# Patient Record
Sex: Male | Born: 1980
Health system: Southern US, Community
[De-identification: ages and names within clinical notes are randomized; demographics above are authoritative.]

## PROBLEM LIST (undated history)

## (undated) DIAGNOSIS — K219 Gastro-esophageal reflux disease without esophagitis: Secondary | ICD-10-CM

## (undated) DIAGNOSIS — G4730 Sleep apnea, unspecified: Secondary | ICD-10-CM

## (undated) DIAGNOSIS — I10 Essential (primary) hypertension: Secondary | ICD-10-CM

## (undated) DIAGNOSIS — T7840XA Allergy, unspecified, initial encounter: Secondary | ICD-10-CM

## (undated) DIAGNOSIS — J45909 Unspecified asthma, uncomplicated: Secondary | ICD-10-CM

## (undated) DIAGNOSIS — Z969 Presence of functional implant, unspecified: Secondary | ICD-10-CM

## (undated) HISTORY — DX: Allergy, unspecified, initial encounter: T78.40XA

## (undated) HISTORY — DX: Gastro-esophageal reflux disease without esophagitis: K21.9

## (undated) HISTORY — DX: Essential (primary) hypertension: I10

## (undated) HISTORY — DX: Sleep apnea, unspecified: G47.30

---

## 2009-09-15 ENCOUNTER — Emergency Department (HOSPITAL_COMMUNITY): Admission: EM | Admit: 2009-09-15 | Discharge: 2009-09-15 | Payer: Self-pay | Admitting: Emergency Medicine

## 2010-01-02 ENCOUNTER — Emergency Department (HOSPITAL_COMMUNITY): Admission: EM | Admit: 2010-01-02 | Discharge: 2010-01-02 | Payer: Self-pay | Admitting: Emergency Medicine

## 2014-07-24 ENCOUNTER — Emergency Department (HOSPITAL_COMMUNITY): Payer: Self-pay

## 2014-07-24 ENCOUNTER — Encounter (HOSPITAL_COMMUNITY): Payer: Self-pay | Admitting: Emergency Medicine

## 2014-07-24 ENCOUNTER — Emergency Department (HOSPITAL_COMMUNITY)
Admission: EM | Admit: 2014-07-24 | Discharge: 2014-07-24 | Disposition: A | Discharge status: Home / Self Care | Payer: Self-pay | Attending: Emergency Medicine | Admitting: Emergency Medicine

## 2014-07-24 DIAGNOSIS — W500XXA Accidental hit or strike by another person, initial encounter: Secondary | ICD-10-CM | POA: Insufficient documentation

## 2014-07-24 DIAGNOSIS — S62317A Displaced fracture of base of fifth metacarpal bone. left hand, initial encounter for closed fracture: Secondary | ICD-10-CM | POA: Insufficient documentation

## 2014-07-24 DIAGNOSIS — Y990 Civilian activity done for income or pay: Secondary | ICD-10-CM | POA: Insufficient documentation

## 2014-07-24 DIAGNOSIS — Z79899 Other long term (current) drug therapy: Secondary | ICD-10-CM | POA: Insufficient documentation

## 2014-07-24 DIAGNOSIS — Y9289 Other specified places as the place of occurrence of the external cause: Secondary | ICD-10-CM | POA: Insufficient documentation

## 2014-07-24 DIAGNOSIS — S62339A Displaced fracture of neck of unspecified metacarpal bone, initial encounter for closed fracture: Secondary | ICD-10-CM

## 2014-07-24 DIAGNOSIS — Y9389 Activity, other specified: Secondary | ICD-10-CM | POA: Insufficient documentation

## 2014-07-24 DIAGNOSIS — J45909 Unspecified asthma, uncomplicated: Secondary | ICD-10-CM | POA: Insufficient documentation

## 2014-07-24 HISTORY — DX: Unspecified asthma, uncomplicated: J45.909

## 2014-07-24 MED ORDER — IBUPROFEN 800 MG PO TABS: 800.0000 mg | ORAL_TABLET | Freq: Three times a day (TID) | ORAL | Status: DC

## 2014-07-24 MED ORDER — HYDROCODONE-ACETAMINOPHEN 5-325 MG PO TABS: 1.0000 | ORAL_TABLET | ORAL | Status: DC | PRN

## 2014-07-24 NOTE — Discharge Instructions (Signed)
Boxer's Fracture You have a break (fracture) of the fifth metacarpal bone. This is commonly called a boxer's fracture. This is the bone in the hand where the little finger attaches. The fracture is in the end of that bone, closest to the little finger. It is usually caused when you hit an object with a clenched fist. Often, the knuckle is pushed down by the impact. Sometimes, the fracture rotates out of position. A boxer's fracture will usually heal within 6 weeks, if it is treated properly and protected from re-injury. Surgery is sometimes needed. A cast, splint, or bulky hand dressing may be used to protect and immobilize a boxer's fracture. Do not remove this device or dressing until your caregiver approves. Keep your hand elevated, and apply ice packs for 15-20 minutes every 2 hours, for the first 2 days. Elevation and ice help reduce swelling and relieve pain. See your caregiver, or an orthopedic specialist, for follow-up care within the next 10 days. This is to make sure your fracture is healing properly. Document Released: 09/01/2005 Document Revised: 11/24/2011 Document Reviewed: 02/19/2007 [facility] Patient Information 2015 [place], [place]. This information is not intended to replace advice given to you by your health care provider. Make sure you discuss any questions you have with your health care provider. Cryotherapy Cryotherapy means treatment with cold. Ice or gel packs can be used to reduce both pain and swelling. Ice is the most helpful within the first 24 to 48 hours after an injury or flare-up from overusing a muscle or joint. Sprains, strains, spasms, burning pain, shooting pain, and aches can all be eased with ice. Ice can also be used when recovering from surgery. Ice is effective, has very few side effects, and is safe for most people to use. PRECAUTIONS  Ice is not a safe treatment option for people with:  Raynaud phenomenon. This is a condition affecting small blood vessels in the  extremities. Exposure to cold may cause your problems to return.  Cold hypersensitivity. There are many forms of cold hypersensitivity, including:  Cold urticaria. Red, itchy hives appear on the skin when the tissues begin to warm after being iced.  Cold erythema. This is a red, itchy rash caused by exposure to cold.  Cold hemoglobinuria. Red blood cells break down when the tissues begin to warm after being iced. The hemoglobin that carry oxygen are passed into the urine because they cannot combine with blood proteins fast enough.  Numbness or altered sensitivity in the area being iced. If you have any of the following conditions, do not use ice until you have discussed cryotherapy with your caregiver:  Heart conditions, such as arrhythmia, angina, or chronic heart disease.  High blood pressure.  Healing wounds or open skin in the area being iced.  Current infections.  Rheumatoid arthritis.  Poor circulation.  Diabetes. Ice slows the blood flow in the region it is applied. This is beneficial when trying to stop inflamed tissues from spreading irritating chemicals to surrounding tissues. However, if you expose your skin to cold temperatures for too long or without the proper protection, you can damage your skin or nerves. Watch for signs of skin damage due to cold. HOME CARE INSTRUCTIONS Follow these tips to use ice and cold packs safely.  Place a dry or damp towel between the ice and skin. A damp towel will cool the skin more quickly, so you may need to shorten the time that the ice is used.  For a more rapid response, add  gentle compression to the ice.  Ice for no more than 10 to 20 minutes at a time. The bonier the area you are icing, the less time it will take to get the benefits of ice.  Check your skin after 5 minutes to make sure there are no signs of a poor response to cold or skin damage.  Rest 20 minutes or more between uses.  Once your skin is numb, you can end your  treatment. You can test numbness by very lightly touching your skin. The touch should be so light that you do not see the skin dimple from the pressure of your fingertip. When using ice, most people will feel these normal sensations in this order: cold, burning, aching, and numbness.  Do not use ice on someone who cannot communicate their responses to pain, such as small children or people with dementia. HOW TO MAKE AN ICE PACK Ice packs are the most common way to use ice therapy. Other methods include ice massage, ice baths, and cryosprays. Muscle creams that cause a cold, tingly feeling do not offer the same benefits that ice offers and should not be used as a substitute unless recommended by your caregiver. To make an ice pack, do one of the following:  Place crushed ice or a bag of frozen vegetables in a sealable plastic bag. Squeeze out the excess air. Place this bag inside another plastic bag. Slide the bag into a pillowcase or place a damp towel between your skin and the bag.  Mix 3 parts water with 1 part rubbing alcohol. Freeze the mixture in a sealable plastic bag. When you remove the mixture from the freezer, it will be slushy. Squeeze out the excess air. Place this bag inside another plastic bag. Slide the bag into a pillowcase or place a damp towel between your skin and the bag. SEEK MEDICAL CARE IF:  You develop white spots on your skin. This may give the skin a blotchy (mottled) appearance.  Your skin turns blue or pale.  Your skin becomes waxy or hard.  Your swelling gets worse. MAKE SURE YOU:   Understand these instructions.  Will watch your condition.  Will get help right away if you are not doing well or get worse. Document Released: 04/28/2011 Document Revised: 01/16/2014 Document Reviewed: 04/28/2011 [facility] Patient Information 2015 [place], [place]. This information is not intended to replace advice given to you by your health care provider. Make sure you discuss any  questions you have with your health care provider.
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
## 2014-07-27 ENCOUNTER — Encounter (HOSPITAL_BASED_OUTPATIENT_CLINIC_OR_DEPARTMENT_OTHER): Payer: Self-pay | Admitting: *Deleted

## 2014-07-27 ENCOUNTER — Other Ambulatory Visit: Payer: Self-pay | Admitting: Orthopedic Surgery

## 2014-07-28 ENCOUNTER — Ambulatory Visit (HOSPITAL_BASED_OUTPATIENT_CLINIC_OR_DEPARTMENT_OTHER): Payer: Self-pay | Admitting: Anesthesiology

## 2014-07-28 ENCOUNTER — Encounter (HOSPITAL_BASED_OUTPATIENT_CLINIC_OR_DEPARTMENT_OTHER): Payer: Self-pay | Admitting: Anesthesiology

## 2014-07-28 ENCOUNTER — Ambulatory Visit (HOSPITAL_BASED_OUTPATIENT_CLINIC_OR_DEPARTMENT_OTHER)
Admission: RE | Admit: 2014-07-28 | Discharge: 2014-07-28 | Disposition: A | Discharge status: Home / Self Care | Payer: Self-pay | Source: Ambulatory Visit | Attending: Orthopedic Surgery | Admitting: Orthopedic Surgery

## 2014-07-28 ENCOUNTER — Encounter (HOSPITAL_BASED_OUTPATIENT_CLINIC_OR_DEPARTMENT_OTHER)
Admission: RE | Disposition: A | Discharge status: Home / Self Care | Payer: Self-pay | Source: Ambulatory Visit | Attending: Orthopedic Surgery

## 2014-07-28 DIAGNOSIS — M79642 Pain in left hand: Secondary | ICD-10-CM | POA: Insufficient documentation

## 2014-07-28 DIAGNOSIS — Z79899 Other long term (current) drug therapy: Secondary | ICD-10-CM | POA: Insufficient documentation

## 2014-07-28 DIAGNOSIS — J45909 Unspecified asthma, uncomplicated: Secondary | ICD-10-CM | POA: Insufficient documentation

## 2014-07-28 HISTORY — PX: OPEN REDUCTION INTERNAL FIXATION (ORIF) METACARPAL: SHX6234

## 2014-07-28 LAB — POCT HEMOGLOBIN-HEMACUE: Hemoglobin: 15.7 g/dL (ref 13.0–17.0)

## 2014-07-28 SURGERY — OPEN REDUCTION INTERNAL FIXATION (ORIF) METACARPAL
Anesthesia: General | Site: Finger | Laterality: Left

## 2014-07-28 MED ORDER — MIDAZOLAM HCL 2 MG/2ML IJ SOLN
1.0000 mg | INTRAMUSCULAR | Status: DC | PRN
  Administered 2014-07-28: 2 mg via INTRAVENOUS

## 2014-07-28 MED ORDER — FENTANYL CITRATE 0.05 MG/ML IJ SOLN
INTRAMUSCULAR | Status: AC
  Filled 2014-07-28: qty 2

## 2014-07-28 MED ORDER — PROPOFOL 10 MG/ML IV BOLUS
INTRAVENOUS | Status: DC | PRN
  Administered 2014-07-28: 400 mg via INTRAVENOUS

## 2014-07-28 MED ORDER — LACTATED RINGERS IV SOLN
INTRAVENOUS | Status: DC
  Administered 2014-07-28 (×2): via INTRAVENOUS

## 2014-07-28 MED ORDER — PHENYLEPHRINE HCL 10 MG/ML IJ SOLN
INTRAMUSCULAR | Status: DC | PRN
  Administered 2014-07-28: 40 ug via INTRAVENOUS

## 2014-07-28 MED ORDER — DEXAMETHASONE SODIUM PHOSPHATE 4 MG/ML IJ SOLN
INTRAMUSCULAR | Status: DC | PRN
  Administered 2014-07-28: 10 mg via INTRAVENOUS

## 2014-07-28 MED ORDER — MIDAZOLAM HCL 5 MG/5ML IJ SOLN
INTRAMUSCULAR | Status: DC | PRN
  Administered 2014-07-28: 2 mg via INTRAVENOUS

## 2014-07-28 MED ORDER — MIDAZOLAM HCL 2 MG/2ML IJ SOLN
INTRAMUSCULAR | Status: AC
  Filled 2014-07-28: qty 2

## 2014-07-28 MED ORDER — CEFAZOLIN SODIUM-DEXTROSE 2-3 GM-% IV SOLR
2.0000 g | INTRAVENOUS | Status: AC
  Administered 2014-07-28: 2 g via INTRAVENOUS

## 2014-07-28 MED ORDER — ONDANSETRON HCL 4 MG/2ML IJ SOLN
INTRAMUSCULAR | Status: DC | PRN
  Administered 2014-07-28: 4 mg via INTRAVENOUS

## 2014-07-28 MED ORDER — EPHEDRINE SULFATE 50 MG/ML IJ SOLN
INTRAMUSCULAR | Status: DC | PRN
  Administered 2014-07-28: 10 mg via INTRAVENOUS
  Administered 2014-07-28: 15 mg via INTRAVENOUS

## 2014-07-28 MED ORDER — CHLORHEXIDINE GLUCONATE 4 % EX LIQD: 60.0000 mL | Freq: Once | CUTANEOUS | Status: DC

## 2014-07-28 MED ORDER — DEXAMETHASONE SODIUM PHOSPHATE 10 MG/ML IJ SOLN
INTRAMUSCULAR | Status: DC | PRN
  Administered 2014-07-28: 10 mg via INTRAVENOUS

## 2014-07-28 MED ORDER — FENTANYL CITRATE 0.05 MG/ML IJ SOLN
INTRAMUSCULAR | Status: AC
  Filled 2014-07-28: qty 6

## 2014-07-28 MED ORDER — HYDROMORPHONE HCL 1 MG/ML IJ SOLN: 0.2500 mg | INTRAMUSCULAR | Status: DC | PRN

## 2014-07-28 MED ORDER — ALBUTEROL SULFATE HFA 108 (90 BASE) MCG/ACT IN AERS
INHALATION_SPRAY | RESPIRATORY_TRACT | Status: AC
  Filled 2014-07-28: qty 6.7

## 2014-07-28 MED ORDER — ALBUTEROL SULFATE HFA 108 (90 BASE) MCG/ACT IN AERS
2.0000 | INHALATION_SPRAY | Freq: Once | RESPIRATORY_TRACT | Status: AC
  Administered 2014-07-28: 2 via RESPIRATORY_TRACT

## 2014-07-28 MED ORDER — OXYCODONE HCL 5 MG/5ML PO SOLN: 5.0000 mg | Freq: Once | ORAL | Status: DC | PRN

## 2014-07-28 MED ORDER — OXYCODONE HCL 5 MG PO TABS: 5.0000 mg | ORAL_TABLET | Freq: Once | ORAL | Status: DC | PRN

## 2014-07-28 MED ORDER — BUPIVACAINE-EPINEPHRINE (PF) 0.5% -1:200000 IJ SOLN
INTRAMUSCULAR | Status: DC | PRN
  Administered 2014-07-28: 28 mL via PERINEURAL

## 2014-07-28 MED ORDER — OXYCODONE-ACETAMINOPHEN 5-325 MG PO TABS: 1.0000 | ORAL_TABLET | ORAL | Status: DC | PRN

## 2014-07-28 MED ORDER — FENTANYL CITRATE 0.05 MG/ML IJ SOLN
INTRAMUSCULAR | Status: DC | PRN
  Administered 2014-07-28: 100 ug via INTRAVENOUS
  Administered 2014-07-28: 50 ug via INTRAVENOUS

## 2014-07-28 MED ORDER — ONDANSETRON HCL 4 MG/2ML IJ SOLN: 4.0000 mg | Freq: Once | INTRAMUSCULAR | Status: DC | PRN

## 2014-07-28 MED ORDER — FENTANYL CITRATE 0.05 MG/ML IJ SOLN
50.0000 ug | INTRAMUSCULAR | Status: DC | PRN
  Administered 2014-07-28: 100 ug via INTRAVENOUS

## 2014-07-28 MED ORDER — CEFAZOLIN SODIUM-DEXTROSE 2-3 GM-% IV SOLR
INTRAVENOUS | Status: AC
  Filled 2014-07-28: qty 50

## 2014-07-28 SURGICAL SUPPLY — 71 items
APL SKNCLS STERI-STRIP NONHPOA (GAUZE/BANDAGES/DRESSINGS)
BANDAGE ELASTIC 3 VELCRO ST LF (GAUZE/BANDAGES/DRESSINGS) ×2 IMPLANT
BANDAGE ELASTIC 4 VELCRO ST LF (GAUZE/BANDAGES/DRESSINGS) IMPLANT
BENZOIN TINCTURE PRP APPL 2/3 (GAUZE/BANDAGES/DRESSINGS) IMPLANT
BIT DRILL 1.1 MINI (BIT) ×1 IMPLANT
BLADE SURG 15 STRL LF DISP TIS (BLADE) ×1 IMPLANT
BLADE SURG 15 STRL SS (BLADE) ×2
BNDG CMPR 9X4 STRL LF SNTH (GAUZE/BANDAGES/DRESSINGS) ×1
BNDG CMPR MD 5X2 ELC HKLP STRL (GAUZE/BANDAGES/DRESSINGS)
BNDG ELASTIC 2 VLCR STRL LF (GAUZE/BANDAGES/DRESSINGS) IMPLANT
BNDG ESMARK 4X9 LF (GAUZE/BANDAGES/DRESSINGS) ×2 IMPLANT
BNDG GAUZE ELAST 4 BULKY (GAUZE/BANDAGES/DRESSINGS) IMPLANT
CANISTER SUCT 1200ML W/VALVE (MISCELLANEOUS) IMPLANT
CORDS BIPOLAR (ELECTRODE) IMPLANT
COVER BACK TABLE 60X90IN (DRAPES) ×2 IMPLANT
CUFF TOURNIQUET SINGLE 18IN (TOURNIQUET CUFF) ×2 IMPLANT
DECANTER SPIKE VIAL GLASS SM (MISCELLANEOUS) IMPLANT
DRAPE EXTREMITY T 121X128X90 (DRAPE) ×2 IMPLANT
DRAPE OEC MINIVIEW 54X84 (DRAPES) ×2 IMPLANT
DRAPE SURG 17X23 STRL (DRAPES) ×2 IMPLANT
DRILL BIT 1.1 MINI (BIT) ×2
DURAPREP 26ML APPLICATOR (WOUND CARE) ×2 IMPLANT
GAUZE SPONGE 4X4 12PLY STRL (GAUZE/BANDAGES/DRESSINGS) ×2 IMPLANT
GAUZE SPONGE 4X4 16PLY XRAY LF (GAUZE/BANDAGES/DRESSINGS) IMPLANT
GAUZE XEROFORM 1X8 LF (GAUZE/BANDAGES/DRESSINGS) IMPLANT
GLOVE BIO SURGEON STRL SZ7.5 (GLOVE) ×2 IMPLANT
GLOVE BIOGEL PI IND STRL 7.0 (GLOVE) ×1 IMPLANT
GLOVE BIOGEL PI IND STRL 8 (GLOVE) ×1 IMPLANT
GLOVE BIOGEL PI INDICATOR 7.0 (GLOVE) ×1
GLOVE BIOGEL PI INDICATOR 8 (GLOVE) ×1
GLOVE ECLIPSE 6.5 STRL STRAW (GLOVE) ×2 IMPLANT
GLOVE SURG SYN 8.0 (GLOVE) ×4 IMPLANT
GOWN STRL REUS W/ TWL LRG LVL3 (GOWN DISPOSABLE) ×1 IMPLANT
GOWN STRL REUS W/TWL LRG LVL3 (GOWN DISPOSABLE) ×2
GOWN STRL REUS W/TWL XL LVL3 (GOWN DISPOSABLE) ×4 IMPLANT
NEEDLE HYPO 25X1 1.5 SAFETY (NEEDLE) IMPLANT
NS IRRIG 1000ML POUR BTL (IV SOLUTION) ×2 IMPLANT
PACK BASIN DAY SURGERY FS (CUSTOM PROCEDURE TRAY) ×2 IMPLANT
PAD CAST 3X4 CTTN HI CHSV (CAST SUPPLIES) ×1 IMPLANT
PAD CAST 4YDX4 CTTN HI CHSV (CAST SUPPLIES) IMPLANT
PADDING CAST ABS 4INX4YD NS (CAST SUPPLIES) ×1
PADDING CAST ABS COTTON 4X4 ST (CAST SUPPLIES) ×1 IMPLANT
PADDING CAST COTTON 3X4 STRL (CAST SUPPLIES) ×2
PADDING CAST COTTON 4X4 STRL (CAST SUPPLIES)
PADDING UNDERCAST 2 STRL (CAST SUPPLIES) ×1
PADDING UNDERCAST 2X4 STRL (CAST SUPPLIES) ×1 IMPLANT
PLATE T 1.5 3H HD/8H SFT (Plate) ×1 IMPLANT
PLATE-T 1.5 3H HD/8H SFT (Plate) ×2 IMPLANT
SCREW CORTEX 1.5X10 (Screw) ×4 IMPLANT
SCREW CORTEX 1.5X16 (Screw) ×6 IMPLANT
SCREW CORTEX 1.5X18 (Screw) ×2 IMPLANT
SHEET MEDIUM DRAPE 40X70 STRL (DRAPES) ×2 IMPLANT
SPLINT PLASTER CAST XFAST 4X15 (CAST SUPPLIES) ×15 IMPLANT
SPLINT PLASTER XTRA FAST SET 4 (CAST SUPPLIES) ×15
STOCKINETTE 4X48 STRL (DRAPES) ×2 IMPLANT
STRIP CLOSURE SKIN 1/2X4 (GAUZE/BANDAGES/DRESSINGS) IMPLANT
SUCTION FRAZIER TIP 10 FR DISP (SUCTIONS) IMPLANT
SUT ETHILON 4 0 PS 2 18 (SUTURE) IMPLANT
SUT ETHILON 5 0 PS 2 18 (SUTURE) IMPLANT
SUT MERSILENE 4 0 P 3 (SUTURE) IMPLANT
SUT PROLENE 3 0 PS 2 (SUTURE) ×2 IMPLANT
SUT VIC AB 4-0 P-3 18XBRD (SUTURE) IMPLANT
SUT VIC AB 4-0 P3 18 (SUTURE)
SUT VICRYL 4-0 PS2 18IN ABS (SUTURE) ×2 IMPLANT
SUT VICRYL RAPIDE 4-0 (SUTURE) IMPLANT
SUT VICRYL RAPIDE 4/0 PS 2 (SUTURE) IMPLANT
SYR BULB 3OZ (MISCELLANEOUS) ×2 IMPLANT
SYRINGE 10CC LL (SYRINGE) IMPLANT
TOWEL OR 17X24 6PK STRL BLUE (TOWEL DISPOSABLE) ×4 IMPLANT
TUBE CONNECTING 20X1/4 (TUBING) IMPLANT
UNDERPAD 30X30 INCONTINENT (UNDERPADS AND DIAPERS) ×2 IMPLANT

## 2014-07-28 NOTE — H&P (Signed)
Greg GellWilliam Stokes is an 33 y.o. male.   Chief Complaint: left hand pain HPI: as above s/p left hand trauma with displaced small metacarpal fracture  Past Medical History  Diagnosis Date  . Asthma   . Metacarpal bone fracture 07/23/2014    left small  . Runny nose 07/27/2014    clear drainage  . Rash 07/27/2014    bilateral arm at inner elbow - states is reaction to soap    Past Surgical History  Procedure Laterality Date  . Closed reduction shoulder dislocation Right 09/16/2009    History reviewed. No pertinent family history. Social History:  reports that he has never smoked. He has never used smokeless tobacco. He reports that he drinks alcohol. He reports that he does not use illicit drugs.  Allergies:  Allergies  Allergen Reactions  . Soap Rash    Medications Prior to Admission  Medication Sig Dispense Refill  . albuterol (PROVENTIL HFA;VENTOLIN HFA) 108 (90 BASE) MCG/ACT inhaler Inhale 1 puff into the lungs every 6 (six) hours as needed for wheezing or shortness of breath.    Marland Kitchen. ibuprofen (ADVIL,MOTRIN) 800 MG tablet Take 1 tablet (800 mg total) by mouth 3 (three) times daily. 21 tablet 0  . HYDROcodone-acetaminophen (NORCO/VICODIN) 5-325 MG per tablet Take 1-2 tablets by mouth every 4 (four) hours as needed. 12 tablet 0    No results found for this or any previous visit (from the past 48 hour(s)). No results found.  Review of Systems  All other systems reviewed and are negative.   Height 6' (1.829 m), weight 88.451 kg (195 lb). Physical Exam  Constitutional: He is oriented to person, place, and time. He appears well-developed and well-nourished.  HENT:  Head: Normocephalic and atraumatic.  Cardiovascular: Normal rate.   Respiratory: Effort normal.  Musculoskeletal:       Left hand: He exhibits tenderness, bony tenderness and deformity.  Displaced left small metacarpal fracture  Neurological: He is alert and oriented to person, place, and time.  Skin: Skin  is warm.  Psychiatric: He has a normal mood and affect. His behavior is normal. Judgment and thought content normal.     Assessment/Plan As above   Plan ORIF  Greg Stokes A 07/28/2014, 10:32 AM

## 2014-07-28 NOTE — Transfer of Care (Signed)
Immediate Anesthesia Transfer of Care Note  Patient: Greg Stokes  Procedure(s) Performed: Procedure(s): OPEN REDUCTION INTERNAL FIXATION (ORIF) LEFT SMALL METACARPAL FRACTURE (Left)  Patient Location: PACU  Anesthesia Type:General and Regional  Level of Consciousness: awake, alert  and oriented  Airway & Oxygen Therapy: Patient Spontanous Breathing and Patient connected to face mask oxygen  Post-op Assessment: Report given to PACU RN and Post -op Vital signs reviewed and stable  Post vital signs: Reviewed and stable  Complications: No apparent anesthesia complications

## 2014-07-28 NOTE — Discharge Instructions (Signed)
HAND SURGERY    HOME CARE INSTRUCTIONS    The following instructions have been prepared to help you care for yourself upon your return home today.  Wound Care:  Keep your hand elevated above the level of your heart. Do not allow it to dangle by your side. Keep the dressing dry and do not remove it unless your doctor advises you to do so. He will usually change it at the time of you post-op visit. Moving your fingers is advised to stimulate circulation but will depend on the site of your surgery. Of course, if you have a splint applied your doctor will advise you about movement.  Activity:  Do not drive or operate machinery today. Rest today and then you may return to your normal activity and work as indicated by your physician.  Diet: Drink liquids today or eat a light diet. You may resume a regular diet tomorrow.  General expectations: Pain for two or three days. Fingers may become slightly swollen.   Unexpected Observations- Call your doctor if any of these occur: Severe pain not relieved by pain medication. Elevated temperature. Dressing soaked with blood. Inability to move fingers. White or bluish color to fingers.  Regional Anesthesia Blocks  1. Numbness or the inability to move the "blocked" extremity may last from 3-48 hours after placement. The length of time depends on the medication injected and your individual response to the medication. If the numbness is not going away after 48 hours, call your surgeon.  2. The extremity that is blocked will need to be protected until the numbness is gone and the  Strength has returned. Because you cannot feel it, you will need to take extra care to avoid injury. Because it may be weak, you may have difficulty moving it or using it. You may not know what position it is in without looking at it while the block is in effect.  3. For blocks in the legs and feet, returning to weight bearing and walking needs to be done carefully. You  will need to wait until the numbness is entirely gone and the strength has returned. You should be able to move your leg and foot normally before you try and bear weight or walk. You will need someone to be with you when you first try to ensure you do not fall and possibly risk injury.  4. Bruising and tenderness at the needle site are common side effects and will resolve in a few days.  5. Persistent numbness or new problems with movement should be communicated to the surgeon or the [facility] [place] [phone])/ [facility] [place] [phone]).    Post Anesthesia Home Care Instructions  Activity: Get plenty of rest for the remainder of the day. A responsible adult should stay with you for 24 hours following the procedure.  For the next 24 hours, DO NOT: -Drive a car -Advertising copywriterperate machinery -Drink alcoholic beverages -Take any medication unless instructed by your physician -Make any legal decisions or sign important papers.  Meals: Start with liquid foods such as gelatin or soup. Progress to regular foods as tolerated. Avoid greasy, spicy, heavy foods. If nausea and/or vomiting occur, drink only clear liquids until the nausea and/or vomiting subsides. Call your physician if vomiting continues.  Special Instructions/Symptoms: Your throat may feel dry or sore from the anesthesia or the breathing tube placed in your throat during surgery. If this causes discomfort, gargle with warm salt water. The discomfort should disappear within 24 hours.

## 2014-07-28 NOTE — Anesthesia Preprocedure Evaluation (Addendum)
Anesthesia Evaluation  Patient identified by MRN, date of birth, ID band Patient awake    Reviewed: Allergy & Precautions, H&P , NPO status , Patient's Chart, lab work & pertinent test results  Airway Mallampati: I TM Distance: >3 FB Neck ROM: Full    Dental  (+) Teeth Intact, Dental Advisory Given   Pulmonary  breath sounds clear to auscultation        Cardiovascular Rhythm:Regular Rate:Normal     Neuro/Psych    GI/Hepatic   Endo/Other    Renal/GU      Musculoskeletal   Abdominal   Peds  Hematology   Anesthesia Other Findings   Reproductive/Obstetrics                           Anesthesia Physical Anesthesia Plan  ASA: II  Anesthesia Plan: General   Post-op Pain Management: MAC Combined w/ Regional for Post-op pain   Induction: Intravenous  Airway Management Planned: LMA  Additional Equipment:   Intra-op Plan:   Post-operative Plan: Extubation in OR  Informed Consent: I have reviewed the patients History and Physical, chart, labs and discussed the procedure including the risks, benefits and alternatives for the proposed anesthesia with the patient or authorized representative who has indicated his/her understanding and acceptance.   Dental advisory given  Plan Discussed with: CRNA, Anesthesiologist and Surgeon  Anesthesia Plan Comments:         Anesthesia Quick Evaluation  

## 2014-07-28 NOTE — Anesthesia Procedure Notes (Addendum)
Anesthesia Regional Block:  Supraclavicular block  Pre-Anesthetic Checklist: ,, timeout performed, Correct Patient, Correct Site, Correct Laterality, Correct Procedure, Correct Position, site marked, Risks and benefits discussed,  Surgical consent,  Pre-op evaluation,  At surgeon's request and post-op pain management  Laterality: Left and Upper  Prep: chloraprep       Needles:  Injection technique: Single-shot  Needle Type: Echogenic Stimulator Needle     Needle Length: 5cm 5 cm Needle Gauge: 21 and 21 G    Additional Needles:  Procedures: ultrasound guided (picture in chart) Supraclavicular block Narrative:  Start time: 07/28/2014 10:46 AM End time: 07/28/2014 10:52 AM Injection made incrementally with aspirations every 5 mL.  Performed by: Personally    Procedure Name: LMA Insertion Date/Time: 07/28/2014 11:50 AM Performed by: Zenia ResidesPAYNE, LINDA D Pre-anesthesia Checklist: Patient identified, Emergency Drugs available, Suction available and Patient being monitored Patient Re-evaluated:Patient Re-evaluated prior to inductionOxygen Delivery Method: Circle System Utilized Preoxygenation: Pre-oxygenation with 100% oxygen Intubation Type: IV induction Ventilation: Mask ventilation without difficulty LMA: LMA inserted LMA Size: 5.0 Number of attempts: 1 Airway Equipment and Method: bite block Placement Confirmation: positive ETCO2 Tube secured with: Tape Dental Injury: Teeth and Oropharynx as per pre-operative assessment

## 2014-07-28 NOTE — Progress Notes (Signed)
  Assisted Dr. Crews with left, ultrasound guided, supraclavicular block. Side rails up, monitors on throughout procedure. See vital signs in flow sheet. Tolerated Procedure well. 

## 2014-07-28 NOTE — Op Note (Signed)
See note [phone]

## 2014-07-28 NOTE — Anesthesia Postprocedure Evaluation (Signed)
  Anesthesia Post-op Note  Patient: Greg Stokes  Procedure(s) Performed: Procedure(s): OPEN REDUCTION INTERNAL FIXATION (ORIF) LEFT SMALL METACARPAL FRACTURE (Left)  Patient Location: PACU  Anesthesia Type: General   Level of Consciousness: awake, alert  and oriented  Airway and Oxygen Therapy: Patient Spontanous Breathing  Post-op Pain: mild  Post-op Assessment: Post-op Vital signs reviewed  Post-op Vital Signs: Reviewed  Last Vitals:  Filed Vitals:   07/28/14 1415  BP: 147/92  Pulse: 100  Temp:   Resp: 13    Complications: No apparent anesthesia complications

## 2014-07-31 ENCOUNTER — Encounter (HOSPITAL_BASED_OUTPATIENT_CLINIC_OR_DEPARTMENT_OTHER): Payer: Self-pay | Admitting: Orthopedic Surgery

## 2014-07-31 NOTE — Op Note (Signed)
NAME:  [name], [name]      ACCOUNT NO.:  [id number]  MEDICAL RECORD NO.:  [id number]  LOCATION:                               FACILITY:  [facility]  PHYSICIAN:  Artist PaisMatthew A. Demira Gwynne, M.D.DATE OF BIRTH:  1980/12/25  DATE OF PROCEDURE:  07/28/2014 DATE OF DISCHARGE:  07/28/2014                              OPERATIVE REPORT   PREOPERATIVE DIAGNOSIS:  Displaced intra-articular fracture, left small metacarpal.  POSTOPERATIVE DIAGNOSIS:  Displaced intra-articular fracture, left small metacarpal.  PROCEDURES:  Open reduction and internal fixation of intra-articular fracture, head neck junction metacarpal fracture, left small finger with 1.5 mm Synthes T-plate and lag screws.  SURGEON:  Artist PaisMatthew A. Mina MarbleWeingold, M.D.  ASSISTANT:  None.  ANESTHESIA:  General.  COMPLICATIONS:  None.  DRAINS:  None.  PROCEDURE IN DETAIL:  Patient was taken to the operating suite.  After induction of adequate general anesthetic, left upper extremity was prepped and draped in usual sterile fashion.  An Esmarch was used to exsanguinate the limb.  Tourniquet was then inflated to 250 mmHg.  At this point in time, incision was made over an obvious apex dorsally angulated deformity.  Skin was incised.  The EDQ and EDC of the small finger were retracted radially.  Dissection was carried down to the fracture site.  We subperiosteally stripped the proximal fragment.  The distal fragment was actually 2 fragments split into the metacarpal head. We did dissection to identify all 3 fragments.  We then reduced the intra-articular component and fixed this with two 1.5 mm screws from ulnar to radial, under direct fluoroscopic guidance.  Once this was done, we then reduced the head fragment to the proximal shaft fragment. There was some comminution in dorsal bone fragments that were used as graft intramedullary.  Once we were able to achieve reduction with a reduction clamp, we took a T-plate and cut it with 2 cortical  screws proximal and 2 cortical screws distal to maintain fracture reduction. Intraoperative fluoroscopy revealed adequate reduction in AP, lateral, and oblique view.  The wound was thoroughly irrigated and loosely closed in layers with 4-0 Vicryl to cover the plate and a 3-0 Prolene subcuticular stitch on the skin.  Steri-Strips, 4x4s, fluffs, and an ulnar-gutter splint was applied.  Patient tolerated the procedure well in a concealed fashion.     Artist PaisMatthew A. Mina MarbleWeingold, M.D.     MAW/MEDQ  D:  07/28/2014  T:  07/28/2014  Job:  [id number]

## 2014-08-30 ENCOUNTER — Ambulatory Visit: Payer: Self-pay

## 2014-09-03 ENCOUNTER — Encounter (HOSPITAL_COMMUNITY): Payer: Self-pay | Admitting: Emergency Medicine

## 2014-09-03 ENCOUNTER — Emergency Department (HOSPITAL_COMMUNITY)
Admission: EM | Admit: 2014-09-03 | Discharge: 2014-09-03 | Disposition: A | Discharge status: Home / Self Care | Payer: Self-pay | Attending: Emergency Medicine | Admitting: Emergency Medicine

## 2014-09-03 DIAGNOSIS — Z79899 Other long term (current) drug therapy: Secondary | ICD-10-CM | POA: Insufficient documentation

## 2014-09-03 DIAGNOSIS — J45901 Unspecified asthma with (acute) exacerbation: Secondary | ICD-10-CM

## 2014-09-03 LAB — I-STAT CHEM 8, ED
BUN: 12 mg/dL (ref 6–23)
CREATININE: 1.1 mg/dL (ref 0.50–1.35)
Calcium, Ion: 1.15 mmol/L (ref 1.12–1.23)
Chloride: 104 mEq/L (ref 96–112)
GLUCOSE: 118 mg/dL — AB (ref 70–99)
HCT: 49 % (ref 39.0–52.0)
Hemoglobin: 16.7 g/dL (ref 13.0–17.0)
Potassium: 3.5 mEq/L — ABNORMAL LOW (ref 3.7–5.3)
Sodium: 143 mEq/L (ref 137–147)
TCO2: 22 mmol/L (ref 0–100)

## 2014-09-03 LAB — CBC WITH DIFFERENTIAL/PLATELET
Basophils Absolute: 0 10*3/uL (ref 0.0–0.1)
Basophils Relative: 0 % (ref 0–1)
EOS ABS: 0.5 10*3/uL (ref 0.0–0.7)
EOS PCT: 8 % — AB (ref 0–5)
HEMATOCRIT: 44.6 % (ref 39.0–52.0)
Hemoglobin: 15.7 g/dL (ref 13.0–17.0)
LYMPHS PCT: 32 % (ref 12–46)
Lymphs Abs: 2.2 10*3/uL (ref 0.7–4.0)
MCH: 30.8 pg (ref 26.0–34.0)
MCHC: 35.2 g/dL (ref 30.0–36.0)
MCV: 87.5 fL (ref 78.0–100.0)
MONO ABS: 0.4 10*3/uL (ref 0.1–1.0)
MONOS PCT: 6 % (ref 3–12)
Neutro Abs: 3.8 10*3/uL (ref 1.7–7.7)
Neutrophils Relative %: 54 % (ref 43–77)
Platelets: 250 10*3/uL (ref 150–400)
RBC: 5.1 MIL/uL (ref 4.22–5.81)
RDW: 12.6 % (ref 11.5–15.5)
WBC: 6.9 10*3/uL (ref 4.0–10.5)

## 2014-09-03 MED ORDER — ALBUTEROL (5 MG/ML) CONTINUOUS INHALATION SOLN
10.0000 mg/h | INHALATION_SOLUTION | RESPIRATORY_TRACT | Status: DC
  Administered 2014-09-03: 10 mg/h via RESPIRATORY_TRACT
  Filled 2014-09-03: qty 20

## 2014-09-03 MED ORDER — PREDNISONE 20 MG PO TABS: 40.0000 mg | ORAL_TABLET | Freq: Every day | ORAL | Status: DC

## 2014-09-03 MED ORDER — MAGNESIUM SULFATE 2 GM/50ML IV SOLN
2.0000 g | INTRAVENOUS | Status: AC
  Administered 2014-09-03: 2 g via INTRAVENOUS
  Filled 2014-09-03: qty 50

## 2014-09-03 MED ORDER — ALBUTEROL SULFATE (2.5 MG/3ML) 0.083% IN NEBU
2.5000 mg | INHALATION_SOLUTION | Freq: Once | RESPIRATORY_TRACT | Status: AC
  Administered 2014-09-03: 2.5 mg via RESPIRATORY_TRACT
  Filled 2014-09-03: qty 3

## 2014-09-03 MED ORDER — ALBUTEROL SULFATE HFA 108 (90 BASE) MCG/ACT IN AERS
2.0000 | INHALATION_SPRAY | RESPIRATORY_TRACT | Status: DC | PRN
  Administered 2014-09-03: 2 via RESPIRATORY_TRACT
  Filled 2014-09-03: qty 6.7

## 2014-09-03 MED ORDER — IPRATROPIUM-ALBUTEROL 0.5-2.5 (3) MG/3ML IN SOLN
3.0000 mL | Freq: Once | RESPIRATORY_TRACT | Status: AC
  Administered 2014-09-03: 3 mL via RESPIRATORY_TRACT
  Filled 2014-09-03: qty 3

## 2014-09-03 MED ORDER — ALBUTEROL SULFATE HFA 108 (90 BASE) MCG/ACT IN AERS: 2.0000 | INHALATION_SPRAY | RESPIRATORY_TRACT | Status: DC | PRN

## 2014-09-03 MED ORDER — METHYLPREDNISOLONE SODIUM SUCC 125 MG IJ SOLR
125.0000 mg | Freq: Once | INTRAMUSCULAR | Status: AC
  Administered 2014-09-03: 125 mg via INTRAVENOUS
  Filled 2014-09-03: qty 2

## 2014-09-03 NOTE — ED Notes (Signed)
RT is at bedside, providing breath treatments. NT is placing patient on cardiac monitor. Informed patient, after everyone has stepped from bedside, will come back to bedside.

## 2014-09-03 NOTE — ED Notes (Addendum)
Pt present with audible wheezing, accessory muscle usage noted. Pt states he does have dx of asthma, he moved in to new home in October at has attacks almost night in his new bedroom. Pt did use inhaler PTA. RA sat 93%, pt speaking in short sentences

## 2014-09-03 NOTE — Discharge Instructions (Signed)

## 2014-09-03 NOTE — ED Provider Notes (Signed)
CSN: [id number]     Arrival date & time 09/03/14  2059 History   First MD Initiated Contact with Patient 09/03/14 2103     Chief Complaint  Patient presents with  . Asthma     (Consider location/radiation/quality/duration/timing/severity/associated sxs/prior Treatment) HPI Comments: Patient with past medical history of asthma, presents to the emergency department with chief complaint of asthma exacerbation. He states that the symptoms started earlier today. He states that he recently moved into her new home, and believes that this was his trigger. He denies any fevers, chills, productive cough, chest pain, nausea, or vomiting. He states that he did try using his inhaler, but do not have any relief.  The history is provided by the patient. No language interpreter was used.    Past Medical History  Diagnosis Date  . Asthma   . Metacarpal bone fracture 07/23/2014    left small  . Runny nose 07/27/2014    clear drainage  . Rash 07/27/2014    bilateral arm at inner elbow - states is reaction to soap   Past Surgical History  Procedure Laterality Date  . Closed reduction shoulder dislocation Right 09/16/2009  . Open reduction internal fixation (orif) metacarpal Left 07/28/2014    Procedure: OPEN REDUCTION INTERNAL FIXATION (ORIF) LEFT SMALL METACARPAL FRACTURE;  Surgeon: Dairl PonderMatthew Weingold, MD;  Location: [facility];  Service: Orthopedics;  Laterality: Left;   No family history on file. History  Substance Use Topics  . Smoking status: Never Smoker   . Smokeless tobacco: Never Used  . Alcohol Use: Yes     Comment: 2 x/week    Review of Systems  Constitutional: Negative for fever and chills.  Respiratory: Positive for shortness of breath and wheezing.   Cardiovascular: Negative for chest pain.  Gastrointestinal: Negative for nausea, vomiting, diarrhea and constipation.  Genitourinary: Negative for dysuria.  All other systems reviewed and are  negative.     Allergies  Soap  Home Medications   Prior to Admission medications   Medication Sig Start Date End Date Taking? Authorizing Provider  albuterol (PROVENTIL HFA;VENTOLIN HFA) 108 (90 BASE) MCG/ACT inhaler Inhale 1 puff into the lungs every 6 (six) hours as needed for wheezing or shortness of breath.    Historical Provider, MD  HYDROcodone-acetaminophen (NORCO/VICODIN) 5-325 MG per tablet Take 1-2 tablets by mouth every 4 (four) hours as needed. 07/24/14   Shari A Upstill, PA-C  ibuprofen (ADVIL,MOTRIN) 800 MG tablet Take 1 tablet (800 mg total) by mouth 3 (three) times daily. 07/24/14   Shari A Upstill, PA-C  oxyCODONE-acetaminophen (ROXICET) 5-325 MG per tablet Take 1 tablet by mouth every 4 (four) hours as needed for severe pain. 07/28/14   Dairl PonderMatthew Weingold, MD   BP 170/104 mmHg  Pulse 109  Temp(Src) 98.2 F (36.8 C) (Oral)  Resp 14  Ht 6' (1.829 m)  Wt 195 lb (88.451 kg)  BMI 26.44 kg/m2  SpO2 98% Physical Exam  Constitutional: He is oriented to person, place, and time. He appears well-developed and well-nourished.  HENT:  Head: Normocephalic and atraumatic.  Eyes: Conjunctivae and EOM are normal. Pupils are equal, round, and reactive to light. Right eye exhibits no discharge. Left eye exhibits no discharge. No scleral icterus.  Neck: Normal range of motion. Neck supple. No JVD present.  Cardiovascular: Normal rate, regular rhythm and normal heart sounds.  Exam reveals no gallop and no friction rub.   No murmur heard. Pulmonary/Chest: Effort normal. No respiratory distress. He has wheezes. He has  no rales. He exhibits no tenderness.  Diffuse out of a wheezes and accessory muscle use, patient speaks in 2-3 word sentences, increased work of breathing  Abdominal: Soft. He exhibits no distension and no mass. There is no tenderness. There is no rebound and no guarding.  Musculoskeletal: Normal range of motion. He exhibits no edema or tenderness.  Neurological: He is  alert and oriented to person, place, and time.  Skin: Skin is warm and dry.  Psychiatric: He has a normal mood and affect. His behavior is normal. Judgment and thought content normal.  Nursing note and vitals reviewed.   ED Course  Procedures (including critical care time) Labs Review Labs Reviewed - No data to display  Imaging Review No results found.   EKG Interpretation None      MDM   Final diagnoses:  Asthma exacerbation    Patient with asthma exacerbation, will treat with nebulizer treatments, give solumedrol, and magnesium, will reassess.  11:06 PM Patient is feeling much better. No more wheezing on lung exam. We'll ambulate with pulse oximeter, and will reassess.  Patient ambulated in ED with O2 saturations maintained >90, no current signs of respiratory distress. Lung exam improved after nebulizer treatment. Prednisone given in the ED and pt will bd dc with 5 day burst. Pt states they are breathing at baseline. Pt has been instructed to continue using prescribed medications and to speak with PCP about today's exacerbation.    Roxy Horsemanobert Leovanni Bjorkman, PA-C 09/03/14 2344  Purvis SheffieldForrest Harrison, MD 09/05/14 [phone]

## 2014-10-24 ENCOUNTER — Ambulatory Visit (INDEPENDENT_AMBULATORY_CARE_PROVIDER_SITE_OTHER): Payer: 59 | Admitting: Family Medicine

## 2014-10-24 VITALS — BP 152/92 | HR 90 | Temp 98.2°F | Resp 16 | Ht 70.0 in | Wt 189.0 lb

## 2014-10-24 DIAGNOSIS — L209 Atopic dermatitis, unspecified: Secondary | ICD-10-CM

## 2014-10-24 DIAGNOSIS — M79642 Pain in left hand: Secondary | ICD-10-CM

## 2014-10-24 DIAGNOSIS — S62308S Unspecified fracture of other metacarpal bone, sequela: Secondary | ICD-10-CM

## 2014-10-24 DIAGNOSIS — J4520 Mild intermittent asthma, uncomplicated: Secondary | ICD-10-CM

## 2014-10-24 MED ORDER — ALBUTEROL SULFATE HFA 108 (90 BASE) MCG/ACT IN AERS: 1.0000 | INHALATION_SPRAY | RESPIRATORY_TRACT | Status: DC | PRN

## 2014-10-24 MED ORDER — TRIAMCINOLONE ACETONIDE 0.1 % EX CREA: 1.0000 "application " | TOPICAL_CREAM | Freq: Two times a day (BID) | CUTANEOUS | Status: DC | PRN

## 2014-10-24 NOTE — Progress Notes (Signed)
Subjective:    Patient ID: Greg Stokes, male    DOB: [birth year], 34 y.o.   MRN: [id number]  This chart was scribed for Meredith Staggers, MD by Littie Deeds, Medical Scribe. This patient was seen in Room 13 and the patient's care was started at 1:26 PM.    HPI HPI Comments: Greg Stokes is a 34 y.o. male with a hx of asthma who presents to the [facility] complaining of itching rashes to bilateral antecubital space.  He is a new patient to me. Here for refill of albuterol for asthma. Was last seen at ER December 20th with asthma exacerbation. Treated with prednisone. He is also here for evaluation of a rash.  Left Metacarpal Bone Fracture: Patient also needs a referral to a hand surgeon for a left hand fracture due to an injury in November 2015. He had seen Dr. Dairl Ponder and had surgery of his left small metacarpal. He is established with Dr. Mina Marble already, but just needs a referral. He notes difficulty with tendon flexion and may require another surgery.  Asthma: Before his ER visit, he was using Primatene tablets, 2 tablets per night since August 2015. He was sent home with Proventil inhaler after the ER visit; he has used it 3 times since then. He does not use the Primatene tablets anymore. Patient states that prior to his hospitalization, he had not had any problems with asthma since childhood, 34 years old. He had recently moved a few months ago.  Rash: The rash has been improving. He believes the rashes to be due to the Primatene tablets. Patient has been using hydrocortisone cream, which has been reducing the itching.  Patient teaches self-defense.   There are no active problems to display for this patient.  Past Medical History  Diagnosis Date  . Asthma   . Metacarpal bone fracture 07/23/2014    left small  . Runny nose 07/27/2014    clear drainage  . Rash 07/27/2014    bilateral arm at inner elbow - states is reaction to soap    Past Surgical History  Procedure Laterality Date  . Closed reduction shoulder dislocation Right 09/16/2009  . Open reduction internal fixation (orif) metacarpal Left 07/28/2014    Procedure: OPEN REDUCTION INTERNAL FIXATION (ORIF) LEFT SMALL METACARPAL FRACTURE;  Surgeon: Dairl Ponder, MD;  Location: [facility];  Service: Orthopedics;  Laterality: Left;   Allergies  Allergen Reactions  . Soap Rash   Prior to Admission medications   Medication Sig Start Date End Date Taking? Authorizing Provider  albuterol (PROVENTIL HFA;VENTOLIN HFA) 108 (90 BASE) MCG/ACT inhaler Inhale 2 puffs into the lungs every 4 (four) hours as needed for wheezing or shortness of breath. 09/03/14  Yes Roxy Horseman, PA-C  diphenhydrAMINE (BENADRYL) 25 MG tablet Take 25 mg by mouth every 6 (six) hours as needed for allergies.    Historical Provider, MD  HYDROcodone-acetaminophen (NORCO/VICODIN) 5-325 MG per tablet Take 1-2 tablets by mouth every 4 (four) hours as needed. Patient not taking: Reported on 09/03/2014 07/24/14   Melvenia Beam A Upstill, PA-C  hydrocortisone cream 1 % Apply 1 application topically 3 (three) times daily as needed for itching.    Historical Provider, MD  ibuprofen (ADVIL,MOTRIN) 800 MG tablet Take 1 tablet (800 mg total) by mouth 3 (three) times daily. Patient not taking: Reported on 09/03/2014 07/24/14   Melvenia Beam A Upstill, PA-C  oxyCODONE-acetaminophen (ROXICET) 5-325 MG per tablet Take 1 tablet by mouth every 4 (four) hours  as needed for severe pain. Patient not taking: Reported on 10/24/2014 07/28/14   Dairl Ponder, MD  predniSONE (DELTASONE) 20 MG tablet Take 2 tablets (40 mg total) by mouth daily. Patient not taking: Reported on 10/24/2014 09/03/14   Roxy Horseman, PA-C  tetrahydrozoline-zinc (VISINE-AC) 0.05-0.25 % ophthalmic solution Place 2 drops into both eyes 3 (three) times daily as needed (red/dry eyes).    Historical Provider, MD   History   Social History  . Marital  Status: Single    Spouse Name: N/A    Number of Children: N/A  . Years of Education: N/A   Occupational History  . Not on file.   Social History Main Topics  . Smoking status: Never Smoker   . Smokeless tobacco: Never Used  . Alcohol Use: Yes     Comment: 2 x/week  . Drug Use: No  . Sexual Activity: Not on file   Other Topics Concern  . Not on file   Social History Narrative     Review of Systems  Skin: Positive for rash.       Objective:   Physical Exam  Constitutional: He is oriented to person, place, and time. He appears well-developed and well-nourished. No distress.  HENT:  Head: Normocephalic and atraumatic.  Mouth/Throat: Oropharynx is clear and moist. No oropharyngeal exudate.  Eyes: Pupils are equal, round, and reactive to light.  Neck: Neck supple.  Cardiovascular: Normal rate.   Pulmonary/Chest: Effort normal.  Musculoskeletal: He exhibits no edema.  Neurological: He is alert and oriented to person, place, and time. No cranial nerve deficit.  Skin: Skin is warm and dry. Rash noted.  Hyperpigmented, slightly thickened patches over bilateral antecubital space. Left antecubital space: slight excoriation laterally with few raw areas without surrounding erythema or exudate.  Psychiatric: He has a normal mood and affect. His behavior is normal.  Vitals reviewed.     Filed Vitals:   10/24/14 1246  BP: 152/92  Pulse: 90  Temp: 98.2 F (36.8 C)  TempSrc: Oral  Resp: 16  Height:  (1.778 m)  Weight: 189 lb (85.73 kg)  SpO2: 95%       Assessment & Plan:   Greg Stokes is a 34 y.o. male Left hand pain, Closed fracture of 5th metacarpal, sequela - Plan: Ambulatory referral to Hand Surgery  - referred back to prior specialist.    Asthma, mild intermittent, uncomplicated - Plan: albuterol (PROVENTIL HFA;VENTOLIN HFA) 108 (90 BASE) MCG/ACT inhaler  -mild intermittent currently.  Continue albuterol prn. rtc precautions if increased use.    Atopic dermatitis - Plan: triamcinolone cream (KENALOG) 0.1 %  -topical TAC BID prn. eucerin as needed. rtc precautions.   Meds ordered this encounter  Medications  . albuterol (PROVENTIL HFA;VENTOLIN HFA) 108 (90 BASE) MCG/ACT inhaler    Sig: Inhale 1-2 puffs into the lungs every 4 (four) hours as needed for wheezing or shortness of breath.    Dispense:  1 Inhaler    Refill:  0  . triamcinolone cream (KENALOG) 0.1 %    Sig: Apply 1 application topically 2 (two) times daily as needed.    Dispense:  30 g    Refill:  1   Patient Instructions  Eucerin and steroid cream up to twice per day for rash on arms. Albuterol if needed for asthma - if increased use of more than twice per week, or nighttime symptoms - return to change meds.  Return to the clinic or go to the nearest emergency room if  any of your symptoms worsen or new symptoms occur.     I personally performed the services described in this documentation, which was scribed in my presence. The recorded information has been reviewed and considered, and addended by me as needed.

## 2014-10-24 NOTE — Patient Instructions (Signed)
Eucerin and steroid cream up to twice per day for rash on arms. Albuterol if needed for asthma - if increased use of more than twice per week, or nighttime symptoms - return to change meds.  Return to the clinic or go to the nearest emergency room if any of your symptoms worsen or new symptoms occur.

## 2015-01-01 ENCOUNTER — Ambulatory Visit (INDEPENDENT_AMBULATORY_CARE_PROVIDER_SITE_OTHER): Payer: 59 | Admitting: Family Medicine

## 2015-01-01 ENCOUNTER — Encounter (HOSPITAL_COMMUNITY): Payer: Self-pay | Admitting: Emergency Medicine

## 2015-01-01 ENCOUNTER — Emergency Department (HOSPITAL_COMMUNITY)
Admission: EM | Admit: 2015-01-01 | Discharge: 2015-01-01 | Disposition: A | Discharge status: Home / Self Care | Payer: 59 | Attending: Emergency Medicine | Admitting: Emergency Medicine

## 2015-01-01 VITALS — BP 176/100 | HR 94 | Temp 98.3°F | Resp 18 | Ht 70.0 in | Wt 196.8 lb

## 2015-01-01 DIAGNOSIS — Y9389 Activity, other specified: Secondary | ICD-10-CM | POA: Diagnosis not present

## 2015-01-01 DIAGNOSIS — J45909 Unspecified asthma, uncomplicated: Secondary | ICD-10-CM | POA: Diagnosis not present

## 2015-01-01 DIAGNOSIS — Y9289 Other specified places as the place of occurrence of the external cause: Secondary | ICD-10-CM | POA: Insufficient documentation

## 2015-01-01 DIAGNOSIS — W540XXA Bitten by dog, initial encounter: Secondary | ICD-10-CM | POA: Diagnosis not present

## 2015-01-01 DIAGNOSIS — Y998 Other external cause status: Secondary | ICD-10-CM | POA: Diagnosis not present

## 2015-01-01 DIAGNOSIS — Z79899 Other long term (current) drug therapy: Secondary | ICD-10-CM | POA: Diagnosis not present

## 2015-01-01 DIAGNOSIS — S41151A Open bite of right upper arm, initial encounter: Secondary | ICD-10-CM

## 2015-01-01 DIAGNOSIS — S51851A Open bite of right forearm, initial encounter: Secondary | ICD-10-CM | POA: Insufficient documentation

## 2015-01-01 DIAGNOSIS — Z23 Encounter for immunization: Secondary | ICD-10-CM | POA: Insufficient documentation

## 2015-01-01 DIAGNOSIS — Z203 Contact with and (suspected) exposure to rabies: Secondary | ICD-10-CM

## 2015-01-01 MED ORDER — TETANUS-DIPHTH-ACELL PERTUSSIS 5-2.5-18.5 LF-MCG/0.5 IM SUSP
0.5000 mL | Freq: Once | INTRAMUSCULAR | Status: AC
  Administered 2015-01-01: 0.5 mL via INTRAMUSCULAR
  Filled 2015-01-01: qty 0.5

## 2015-01-01 MED ORDER — RABIES IMMUNE GLOBULIN 150 UNIT/ML IM INJ
20.0000 [IU]/kg | INJECTION | Freq: Once | INTRAMUSCULAR | Status: AC
Start: 2015-01-01 — End: 2015-01-01
  Administered 2015-01-01: 1800 [IU] via INTRAMUSCULAR
  Filled 2015-01-01: qty 12

## 2015-01-01 MED ORDER — AMOXICILLIN-POT CLAVULANATE 875-125 MG PO TABS: 1.0000 | ORAL_TABLET | Freq: Two times a day (BID) | ORAL | Status: DC

## 2015-01-01 MED ORDER — RABIES VACCINE, PCEC IM SUSR
1.0000 mL | Freq: Once | INTRAMUSCULAR | Status: AC
  Administered 2015-01-01: 1 mL via INTRAMUSCULAR
  Filled 2015-01-01: qty 1

## 2015-01-01 NOTE — ED Provider Notes (Signed)
CSN: [id number]     Arrival date & time 01/01/15  1704 History  This chart is scribed for non-physician practitioner, Junius Finner, PA-C, working with Arby Barrette, MD by Abel Presto, ED Scribe.  This patient was seen in room TR07C/TR07C and the patient's care was started 5:36 PM.      Chief Complaint  Patient presents with  . Animal Bite    Patient is a 34 y.o. male presenting with animal bite. The history is provided by the patient. No language interpreter was used.  Animal Bite Associated symptoms: no fever    HPI Comments: Greg Stokes is a 34 y.o. male who presents to the Emergency Department complaining of dog bite to right posterior forearm with onset last night. Pt states dog was medium sized lab mix. Pt does not know the dog but animal control was contacted.  Pt states the dog has not been captured yet and is uncertain if it would be able to be observed for 10 days and wants to get the rabies vaccine to be safe.  Pt was seen at Urgent Care and sent here for evaluation and rabies vaccination as pt has a small abrasion with some bruising to Right forearm.  Pain is 1/10 at worst. No other injuries from the dog.  Pt is unsure of tetanus status. Pt denies fever and any other complaints.   Past Medical History  Diagnosis Date  . Asthma   . Metacarpal bone fracture 07/23/2014    left small  . Runny nose 07/27/2014    clear drainage  . Rash 07/27/2014    bilateral arm at inner elbow - states is reaction to soap   Past Surgical History  Procedure Laterality Date  . Closed reduction shoulder dislocation Right 09/16/2009  . Open reduction internal fixation (orif) metacarpal Left 07/28/2014    Procedure: OPEN REDUCTION INTERNAL FIXATION (ORIF) LEFT SMALL METACARPAL FRACTURE;  Surgeon: Dairl Ponder, MD;  Location: [facility];  Service: Orthopedics;  Laterality: Left;   Family History  Problem Relation Age of Onset  . Cancer Mother   . Hyperlipidemia  Father    History  Substance Use Topics  . Smoking status: Never Smoker   . Smokeless tobacco: Never Used  . Alcohol Use: 1.8 oz/week    3 Glasses of wine per week     Comment: 2 x/week    Review of Systems  Constitutional: Negative for fever and chills.  Skin: Positive for wound.  All other systems reviewed and are negative.     Allergies  Soap  Home Medications   Prior to Admission medications   Medication Sig Start Date End Date Taking? Authorizing Provider  albuterol (PROVENTIL HFA;VENTOLIN HFA) 108 (90 BASE) MCG/ACT inhaler Inhale 1-2 puffs into the lungs every 4 (four) hours as needed for wheezing or shortness of breath. 10/24/14   Shade Flood, MD  amoxicillin-clavulanate (AUGMENTIN) 875-125 MG per tablet Take 1 tablet by mouth 2 (two) times daily. 01/01/15   Junius Finner, PA-C  diphenhydrAMINE (BENADRYL) 25 MG tablet Take 25 mg by mouth every 6 (six) hours as needed for allergies.    Historical Provider, MD   BP 166/84 mmHg  Pulse 84  Temp(Src) 98.1 F (36.7 C) (Oral)  Resp 22  SpO2 100% Physical Exam  Constitutional: He is oriented to person, place, and time. He appears well-developed and well-nourished.  HENT:  Head: Normocephalic and atraumatic.  Eyes: EOM are normal.  Neck: Normal range of motion.  Cardiovascular: Normal rate.  Pulses:      Radial pulses are 2+ on the right side.  Pulmonary/Chest: Effort normal.  Musculoskeletal: Normal range of motion.  Neurological: He is alert and oriented to person, place, and time.  Sensation intact  Skin: Skin is warm and dry.  3 cm area of ecchymosis on volar aspect of right forearm; pinpoint area of dried red blood, no active discharge or bleeding, no induration or fluctuance  Psychiatric: He has a normal mood and affect. His behavior is normal.  Nursing note and vitals reviewed.   ED Course  Procedures   The wound is cleansed, debrided of foreign material as much as possible, and dressed. The patient  is alerted to watch for any signs of infection (redness, pus, pain, increased swelling or fever) and call if such occurs. Home wound care instructions are provided. Tetanus vaccination status reviewed: tetanus   DIAGNOSTIC STUDIES: Oxygen Saturation is 95% on room air, normal by my interpretation.    COORDINATION OF CARE: 5:39 PM Discussed treatment plan with patient at beside, the patient agrees with the plan and has no further questions at this time.   Labs Review Labs Reviewed - No data to display  Imaging Review No results found.   EKG Interpretation None      MDM   Final diagnoses:  Dog bite  Need for post exposure prophylaxis for rabies   Pt presenting to ED for rabies vaccine. Wound cleaned. Pt given Tdap in ED. Will place on Augmentin. Home care instrucitons provided as well as rabies vaccine schedule to f/u with urgent care. Pt verbalized understanding and agreement with tx plan.    I personally performed the services described in this documentation, which was scribed in my presence. The recorded information has been reviewed and is accurate.    Junius Finnerrin O'Malley, PA-C 01/02/15 [phone]  Arby BarretteMarcy Pfeiffer, MD 01/10/15 [phone]

## 2015-01-01 NOTE — ED Notes (Signed)
Pt sent here for eval for rabies vaccination after being bitten by a dog yesterday; pt with small abrasion to right arm

## 2015-01-01 NOTE — Progress Notes (Signed)
Subjective: 34 year old healthy male who was bitten or deeply scratched by a dog yesterday. He apparently had petted the dog, then the dog scratched that somebody else. Then the patient went back to the dog at which point the dog attacked his right arm. There is a area of crusted bite or tear of the skin about 1 cm long, and scrape marks and bruises of his right forearm. The dog was the least though had some chain on the neck that would indicate that he might have broken loose.  Objective: The primary area of bruising and erythema is about 10 x 6 cm at the largest diameters. There is a little role of scratch marks along the radial aspect of the volar surface of a right arm. These are parallel scratches that could've come from claw or teeth. Just toward the ulnar aspect from those scratches are the deeper marks and a larger area of bruising as outlined above. It is not obviously infected as of yet. Hand function is fine.  Assessment: Dog bite versus deep scratch right forearm  Plan: Spoke to animal control regarding this and they're looking for the fax on it. I informed them that this is something that would need to be checked out to try and locate the dog. As the animal control stated, whether it is a scratch or bite and you don't know which does not make a difference, that further intervention is required. I explained to the patient that if they could not find the dog and impounded he would probably need rabies vaccination. That evaluation and decision is only done at the emergency room, so we'll send him over to the hospital ER.

## 2015-01-01 NOTE — Patient Instructions (Addendum)
[facility]  Augmentin 1 twice daily  Return at any time if concern about worsening infection.

## 2015-01-01 NOTE — ED Notes (Signed)
Declined W/C at D/C and was escorted to lobby by RN. 

## 2015-01-03 ENCOUNTER — Encounter (HOSPITAL_COMMUNITY): Payer: Self-pay | Admitting: *Deleted

## 2015-01-03 ENCOUNTER — Emergency Department (HOSPITAL_COMMUNITY)
Admission: EM | Admit: 2015-01-03 | Discharge: 2015-01-03 | Disposition: A | Discharge status: Home / Self Care | Payer: 59 | Source: Home / Self Care

## 2015-01-03 MED ORDER — RABIES VACCINE, PCEC IM SUSR
1.0000 mL | Freq: Once | INTRAMUSCULAR | Status: AC
  Administered 2015-01-03: 1 mL via INTRAMUSCULAR

## 2015-01-03 MED ORDER — RABIES VACCINE, PCEC IM SUSR
INTRAMUSCULAR | Status: AC
Start: 2015-01-03 — End: 2015-01-03
  Filled 2015-01-03: qty 1

## 2015-01-03 NOTE — ED Notes (Signed)
Pt is here for day 3 rabies injection. Day 1 on 4/18 in [facility]. Pt has schedule with him with dates. Denies any new concerns.

## 2015-01-03 NOTE — ED Notes (Signed)
BP 157/98. [facility]  Pt states BP has been elevated since November. Pt verbalizes understanding.

## 2015-01-04 ENCOUNTER — Other Ambulatory Visit: Payer: Self-pay | Admitting: Family Medicine

## 2015-01-06 ENCOUNTER — Other Ambulatory Visit: Payer: Self-pay | Admitting: Family Medicine

## 2015-02-06 ENCOUNTER — Encounter: Payer: Self-pay | Admitting: Family Medicine

## 2015-02-06 DIAGNOSIS — M79642 Pain in left hand: Secondary | ICD-10-CM

## 2015-02-10 ENCOUNTER — Encounter (HOSPITAL_COMMUNITY): Payer: Self-pay | Admitting: Emergency Medicine

## 2015-02-10 ENCOUNTER — Emergency Department (HOSPITAL_COMMUNITY)
Admission: EM | Admit: 2015-02-10 | Discharge: 2015-02-10 | Disposition: A | Discharge status: Home / Self Care | Payer: 59 | Attending: Emergency Medicine | Admitting: Emergency Medicine

## 2015-02-10 ENCOUNTER — Emergency Department (HOSPITAL_COMMUNITY): Payer: 59

## 2015-02-10 DIAGNOSIS — Z792 Long term (current) use of antibiotics: Secondary | ICD-10-CM | POA: Diagnosis not present

## 2015-02-10 DIAGNOSIS — Z8781 Personal history of (healed) traumatic fracture: Secondary | ICD-10-CM | POA: Diagnosis not present

## 2015-02-10 DIAGNOSIS — J45901 Unspecified asthma with (acute) exacerbation: Secondary | ICD-10-CM | POA: Diagnosis not present

## 2015-02-10 DIAGNOSIS — R0602 Shortness of breath: Secondary | ICD-10-CM | POA: Diagnosis present

## 2015-02-10 MED ORDER — PREDNISONE 20 MG PO TABS
60.0000 mg | ORAL_TABLET | Freq: Once | ORAL | Status: AC
  Administered 2015-02-10: 60 mg via ORAL
  Filled 2015-02-10: qty 3

## 2015-02-10 MED ORDER — ALBUTEROL SULFATE HFA 108 (90 BASE) MCG/ACT IN AERS
1.0000 | INHALATION_SPRAY | RESPIRATORY_TRACT | Status: DC | PRN
  Administered 2015-02-10: 2 via RESPIRATORY_TRACT
  Filled 2015-02-10: qty 6.7

## 2015-02-10 MED ORDER — ALBUTEROL SULFATE (2.5 MG/3ML) 0.083% IN NEBU
5.0000 mg | INHALATION_SOLUTION | Freq: Once | RESPIRATORY_TRACT | Status: AC
  Administered 2015-02-10: 5 mg via RESPIRATORY_TRACT
  Filled 2015-02-10: qty 6

## 2015-02-10 MED ORDER — IPRATROPIUM-ALBUTEROL 0.5-2.5 (3) MG/3ML IN SOLN: 3.0000 mL | RESPIRATORY_TRACT | Status: DC

## 2015-02-10 MED ORDER — PREDNISONE 20 MG PO TABS: 40.0000 mg | ORAL_TABLET | Freq: Every day | ORAL | Status: DC

## 2015-02-10 MED ORDER — IPRATROPIUM-ALBUTEROL 0.5-2.5 (3) MG/3ML IN SOLN
3.0000 mL | Freq: Once | RESPIRATORY_TRACT | Status: AC
  Administered 2015-02-10: 3 mL via RESPIRATORY_TRACT
  Filled 2015-02-10: qty 3

## 2015-02-10 MED ORDER — IPRATROPIUM BROMIDE 0.02 % IN SOLN
0.5000 mg | Freq: Once | RESPIRATORY_TRACT | Status: AC
  Administered 2015-02-10: 0.5 mg via RESPIRATORY_TRACT
  Filled 2015-02-10: qty 2.5

## 2015-02-10 NOTE — ED Notes (Signed)
Pt c/o asthma flare up onset 0115, pt last took his inhaler 30 mins PTA. Pt last had asthma flare up in December. Pt talking in complete sentences without difficulty.

## 2015-02-10 NOTE — ED Notes (Signed)
Patient is alert and orientedx4.  Patient was explained discharge instructions and they understood them with no questions.   

## 2015-02-10 NOTE — ED Provider Notes (Signed)
CSN: [id number]     Arrival date & time 02/10/15  4098 History   First MD Initiated Contact with Patient 02/10/15 [phone]     Chief Complaint  Patient presents with  . Asthma     (Consider location/radiation/quality/duration/timing/severity/associated sxs/prior Treatment) Patient is a 34 y.o. male presenting with asthma. The history is provided by the patient and medical records.  Asthma Associated symptoms include coughing.    This is a 34 year old male with past medical history significant for asthma, presenting to the ED for shortness of breath cough, and wheezing beginning today at 0115.  Patient states for the past 2 days he has had upper respiratory symptoms of cough and nasal congestion. He denies fever, chills, sweats.  States this morning he has felt himself wheezing and has chest tightness consistent with prior asthma attacks.  He denies chest pain, diaphoresis, weakness, numbness, or dizziness.  He has used his home inhaler without relief.  Patient does not have home nebulizer machine.  Past Medical History  Diagnosis Date  . Asthma   . Metacarpal bone fracture 07/23/2014    left small  . Runny nose 07/27/2014    clear drainage  . Rash 07/27/2014    bilateral arm at inner elbow - states is reaction to soap   Past Surgical History  Procedure Laterality Date  . Closed reduction shoulder dislocation Right 09/16/2009  . Open reduction internal fixation (orif) metacarpal Left 07/28/2014    Procedure: OPEN REDUCTION INTERNAL FIXATION (ORIF) LEFT SMALL METACARPAL FRACTURE;  Surgeon: Dairl Ponder, MD;  Location: [facility];  Service: Orthopedics;  Laterality: Left;   Family History  Problem Relation Age of Onset  . Cancer Mother   . Hyperlipidemia Father    History  Substance Use Topics  . Smoking status: Never Smoker   . Smokeless tobacco: Never Used  . Alcohol Use: 1.8 oz/week    3 Glasses of wine per week     Comment: 2 x/week    Review of Systems   Respiratory: Positive for cough, shortness of breath and wheezing.   All other systems reviewed and are negative.     Allergies  Soap  Home Medications   Prior to Admission medications   Medication Sig Start Date End Date Taking? Authorizing Provider  amoxicillin-clavulanate (AUGMENTIN) 875-125 MG per tablet Take 1 tablet by mouth 2 (two) times daily. 01/01/15   Junius Finner, PA-C  diphenhydrAMINE (BENADRYL) 25 MG tablet Take 25 mg by mouth every 6 (six) hours as needed for allergies.    Historical Provider, MD  PROVENTIL HFA 108 (90 BASE) MCG/ACT inhaler INHALE ONE TO TWO PUFFS BY MOUTH EVERY 4 HOURS AS NEEDED FOR WHEEZING FOR SHORTNESS OF BREATH 01/08/15   Todd McVeigh, PA   BP 188/107 mmHg  Pulse 108  Temp(Src) 98.8 F (37.1 C) (Oral)  Resp 16  Ht 5' 11.75" (1.822 m)  Wt 195 lb (88.451 kg)  BMI 26.64 kg/m2  SpO2 95%   Physical Exam  Constitutional: He is oriented to person, place, and time. He appears well-developed and well-nourished. No distress.  HENT:  Head: Normocephalic and atraumatic.  Right Ear: Tympanic membrane and ear canal normal.  Left Ear: Tympanic membrane and ear canal normal.  Nose: Nose normal.  Mouth/Throat: Uvula is midline, oropharynx is clear and moist and mucous membranes are normal. No oropharyngeal exudate, posterior oropharyngeal edema, posterior oropharyngeal erythema or tonsillar abscesses.  Eyes: Conjunctivae and EOM are normal. Pupils are equal, round, and reactive to light.  Neck: Normal range of motion. Neck supple.  Cardiovascular: Normal rate, regular rhythm and normal heart sounds.   Pulmonary/Chest: No respiratory distress. He has wheezes. He has no rhonchi.  Diffuse expiratory wheezes, no retractions or accessory muscle use; speaking in full sentences without difficulty  Abdominal: Soft. Bowel sounds are normal. There is no tenderness. There is no guarding.  Musculoskeletal: Normal range of motion.  Neurological: He is alert and  oriented to person, place, and time.  Skin: Skin is warm and dry. He is not diaphoretic.  Psychiatric: He has a normal mood and affect.  Nursing note and vitals reviewed.   ED Course  Procedures (including critical care time) Labs Review Labs Reviewed - No data to display  Imaging Review Dg Chest 2 View (if Patient Has Fever And/or Copd)  02/10/2015   CLINICAL DATA:  Asthma, wheezing and shortness of breath.  EXAM: CHEST - 2 VIEW  COMPARISON:  None  FINDINGS: Mild bilateral pulmonary hyperinflation and central airway thickening present likely reflecting asthmatic bronchitis. There is no evidence of pulmonary edema, focal airspace consolidation, pneumothorax, nodule or pleural fluid. The heart size and mediastinal contours are within normal limits. The visualized skeletal structures are unremarkable.  IMPRESSION: Mild hyperinflation with suggestion of bilateral central bronchial thickening. Findings are likely consistent with asthmatic bronchitis.   Electronically Signed   By: Irish LackGlenn  Yamagata M.D.   On: 02/10/2015 09:37     EKG Interpretation None      MDM   Final diagnoses:  Asthma, unspecified asthma severity, with acute exacerbation   34 year old male here with asthma exacerbation which began last night. He has recently had URI type symptoms of cough and nasal congestion. Patient afebrile, nontoxic. He has diffuse expiratory wheezes without retractions or accessory muscle use. He is able to speak in full sentences without difficulty. Chest x-ray with findings consistent with asthmatic bronchitis. Patient was given dose of prednisone and 2 nebulizer treatments here in the ED with resolution of symptoms. His vital signs remained stable on room air. Patient does have slight tachycardia, this is likely from albuterol. Do not suspect ACS or PE. Patient will be discharged home on prednisone taper, given refill albuterol inhaler here in ED.  Discussed plan with patient, he/she acknowledged  understanding and agreed with plan of care.  Return precautions given for new or worsening symptoms.  Garlon HatchetLisa M Sanders, PA-C 02/10/15 1310  Purvis SheffieldForrest Harrison, MD 02/10/15 1325

## 2015-02-10 NOTE — ED Notes (Signed)
Pt placed in gown and in bed. Pt monitored by pulse ox, bp cuff, and 5-lead. 

## 2015-02-10 NOTE — Discharge Instructions (Signed)
Take the prescribed medication as directed starting tomorrow, you have already had today's dose. Use inhaler as needed for shortness of breath/wheezing. Return to the ED for new or worsening symptoms.

## 2015-02-14 DIAGNOSIS — Z969 Presence of functional implant, unspecified: Secondary | ICD-10-CM

## 2015-02-14 HISTORY — DX: Presence of functional implant, unspecified: Z96.9

## 2015-02-15 ENCOUNTER — Encounter (HOSPITAL_COMMUNITY): Payer: Self-pay | Admitting: *Deleted

## 2015-02-15 ENCOUNTER — Other Ambulatory Visit: Payer: Self-pay | Admitting: Orthopedic Surgery

## 2015-02-16 ENCOUNTER — Inpatient Hospital Stay (HOSPITAL_COMMUNITY)
Admission: RE | Admit: 2015-02-16 | Discharge: 2015-02-16 | Disposition: A | Discharge status: Home / Self Care | Payer: 59 | Source: Ambulatory Visit

## 2015-02-19 ENCOUNTER — Encounter (HOSPITAL_BASED_OUTPATIENT_CLINIC_OR_DEPARTMENT_OTHER): Payer: Self-pay

## 2015-02-19 ENCOUNTER — Encounter (HOSPITAL_BASED_OUTPATIENT_CLINIC_OR_DEPARTMENT_OTHER)
Admission: RE | Disposition: A | Discharge status: Home / Self Care | Payer: Self-pay | Source: Ambulatory Visit | Attending: Orthopedic Surgery

## 2015-02-19 ENCOUNTER — Ambulatory Visit (HOSPITAL_BASED_OUTPATIENT_CLINIC_OR_DEPARTMENT_OTHER): Payer: 59 | Admitting: Anesthesiology

## 2015-02-19 ENCOUNTER — Telehealth: Payer: Self-pay | Admitting: Family Medicine

## 2015-02-19 ENCOUNTER — Ambulatory Visit (HOSPITAL_BASED_OUTPATIENT_CLINIC_OR_DEPARTMENT_OTHER)
Admission: RE | Admit: 2015-02-19 | Discharge: 2015-02-19 | Disposition: A | Discharge status: Home / Self Care | Payer: 59 | Source: Ambulatory Visit | Attending: Orthopedic Surgery | Admitting: Orthopedic Surgery

## 2015-02-19 DIAGNOSIS — Y838 Other surgical procedures as the cause of abnormal reaction of the patient, or of later complication, without mention of misadventure at the time of the procedure: Secondary | ICD-10-CM | POA: Insufficient documentation

## 2015-02-19 DIAGNOSIS — Z91048 Other nonmedicinal substance allergy status: Secondary | ICD-10-CM | POA: Insufficient documentation

## 2015-02-19 DIAGNOSIS — J45909 Unspecified asthma, uncomplicated: Secondary | ICD-10-CM | POA: Diagnosis not present

## 2015-02-19 DIAGNOSIS — T8484XA Pain due to internal orthopedic prosthetic devices, implants and grafts, initial encounter: Secondary | ICD-10-CM | POA: Insufficient documentation

## 2015-02-19 HISTORY — DX: Presence of functional implant, unspecified: Z96.9

## 2015-02-19 HISTORY — PX: HARDWARE REMOVAL: SHX979

## 2015-02-19 HISTORY — PX: TENOLYSIS: SHX396

## 2015-02-19 LAB — POCT HEMOGLOBIN-HEMACUE: Hemoglobin: 15.9 g/dL (ref 13.0–17.0)

## 2015-02-19 SURGERY — REMOVAL, HARDWARE
Anesthesia: General | Site: Hand | Laterality: Left

## 2015-02-19 MED ORDER — OXYCODONE HCL 5 MG PO TABS: 5.0000 mg | ORAL_TABLET | Freq: Once | ORAL | Status: DC | PRN

## 2015-02-19 MED ORDER — OXYCODONE HCL 5 MG/5ML PO SOLN
5.0000 mg | Freq: Once | ORAL | Status: DC | PRN
Start: 2015-02-19 — End: 2015-02-19

## 2015-02-19 MED ORDER — BUPIVACAINE HCL (PF) 0.25 % IJ SOLN
INTRAMUSCULAR | Status: DC | PRN
  Administered 2015-02-19: 8 mL

## 2015-02-19 MED ORDER — CEFAZOLIN SODIUM-DEXTROSE 2-3 GM-% IV SOLR
2.0000 g | INTRAVENOUS | Status: AC
  Administered 2015-02-19: 2 g via INTRAVENOUS

## 2015-02-19 MED ORDER — HYDROMORPHONE HCL 1 MG/ML IJ SOLN: 0.2500 mg | INTRAMUSCULAR | Status: DC | PRN

## 2015-02-19 MED ORDER — MIDAZOLAM HCL 2 MG/2ML IJ SOLN
INTRAMUSCULAR | Status: AC
  Filled 2015-02-19: qty 2

## 2015-02-19 MED ORDER — LACTATED RINGERS IV SOLN
INTRAVENOUS | Status: DC
  Administered 2015-02-19 (×2): via INTRAVENOUS

## 2015-02-19 MED ORDER — FENTANYL CITRATE (PF) 100 MCG/2ML IJ SOLN
INTRAMUSCULAR | Status: DC | PRN
  Administered 2015-02-19: 100 ug via INTRAVENOUS

## 2015-02-19 MED ORDER — PROMETHAZINE HCL 25 MG/ML IJ SOLN: 6.2500 mg | INTRAMUSCULAR | Status: DC | PRN

## 2015-02-19 MED ORDER — GLYCOPYRROLATE 0.2 MG/ML IJ SOLN: 0.2000 mg | Freq: Once | INTRAMUSCULAR | Status: DC | PRN

## 2015-02-19 MED ORDER — MEPERIDINE HCL 25 MG/ML IJ SOLN: 6.2500 mg | INTRAMUSCULAR | Status: DC | PRN

## 2015-02-19 MED ORDER — KETOROLAC TROMETHAMINE 30 MG/ML IJ SOLN: 30.0000 mg | Freq: Once | INTRAMUSCULAR | Status: DC | PRN

## 2015-02-19 MED ORDER — MIDAZOLAM HCL 2 MG/2ML IJ SOLN
1.0000 mg | INTRAMUSCULAR | Status: DC | PRN
  Administered 2015-02-19: 2 mg via INTRAVENOUS

## 2015-02-19 MED ORDER — CEFAZOLIN SODIUM-DEXTROSE 2-3 GM-% IV SOLR
INTRAVENOUS | Status: AC
  Filled 2015-02-19: qty 50

## 2015-02-19 MED ORDER — LIDOCAINE HCL (CARDIAC) 20 MG/ML IV SOLN
INTRAVENOUS | Status: DC | PRN
  Administered 2015-02-19: 80 mg via INTRAVENOUS

## 2015-02-19 MED ORDER — FENTANYL CITRATE (PF) 100 MCG/2ML IJ SOLN
INTRAMUSCULAR | Status: AC
  Filled 2015-02-19: qty 4

## 2015-02-19 MED ORDER — CHLORHEXIDINE GLUCONATE 4 % EX LIQD: 60.0000 mL | Freq: Once | CUTANEOUS | Status: DC

## 2015-02-19 MED ORDER — ONDANSETRON HCL 4 MG/2ML IJ SOLN
INTRAMUSCULAR | Status: DC | PRN
  Administered 2015-02-19: 4 mg via INTRAVENOUS

## 2015-02-19 MED ORDER — OXYCODONE-ACETAMINOPHEN 5-325 MG PO TABS: 1.0000 | ORAL_TABLET | ORAL | Status: DC | PRN

## 2015-02-19 MED ORDER — PROPOFOL 10 MG/ML IV BOLUS
INTRAVENOUS | Status: DC | PRN
  Administered 2015-02-19: 200 mg via INTRAVENOUS

## 2015-02-19 MED ORDER — FENTANYL CITRATE (PF) 100 MCG/2ML IJ SOLN: 50.0000 ug | INTRAMUSCULAR | Status: DC | PRN

## 2015-02-19 MED ORDER — BUPIVACAINE HCL (PF) 0.25 % IJ SOLN
INTRAMUSCULAR | Status: AC
  Filled 2015-02-19: qty 30

## 2015-02-19 MED ORDER — 0.9 % SODIUM CHLORIDE (POUR BTL) OPTIME
TOPICAL | Status: DC | PRN
  Administered 2015-02-19: 200 mL

## 2015-02-19 MED ORDER — DEXAMETHASONE SODIUM PHOSPHATE 10 MG/ML IJ SOLN
INTRAMUSCULAR | Status: DC | PRN
  Administered 2015-02-19: 10 mg via INTRAVENOUS

## 2015-02-19 SURGICAL SUPPLY — 70 items
APL SKNCLS STERI-STRIP NONHPOA (GAUZE/BANDAGES/DRESSINGS)
BANDAGE ELASTIC 3 VELCRO ST LF (GAUZE/BANDAGES/DRESSINGS) ×2 IMPLANT
BANDAGE ELASTIC 4 VELCRO ST LF (GAUZE/BANDAGES/DRESSINGS) ×2 IMPLANT
BENZOIN TINCTURE PRP APPL 2/3 (GAUZE/BANDAGES/DRESSINGS) IMPLANT
BLADE MINI RND TIP GREEN BEAV (BLADE) IMPLANT
BLADE SURG 15 STRL LF DISP TIS (BLADE) ×2 IMPLANT
BLADE SURG 15 STRL SS (BLADE) ×4
BNDG CMPR 9X4 STRL LF SNTH (GAUZE/BANDAGES/DRESSINGS) ×1
BNDG CMPR MD 5X2 ELC HKLP STRL (GAUZE/BANDAGES/DRESSINGS)
BNDG ELASTIC 2 VLCR STRL LF (GAUZE/BANDAGES/DRESSINGS) IMPLANT
BNDG ESMARK 4X9 LF (GAUZE/BANDAGES/DRESSINGS) ×2 IMPLANT
BNDG GAUZE ELAST 4 BULKY (GAUZE/BANDAGES/DRESSINGS) ×2 IMPLANT
BRUSH SCRUB EZ PLAIN DRY (MISCELLANEOUS) IMPLANT
CORDS BIPOLAR (ELECTRODE) ×2 IMPLANT
COVER BACK TABLE 60X90IN (DRAPES) ×2 IMPLANT
CUFF TOURNIQUET SINGLE 18IN (TOURNIQUET CUFF) ×2 IMPLANT
DECANTER SPIKE VIAL GLASS SM (MISCELLANEOUS) ×2 IMPLANT
DRAPE EXTREMITY T 121X128X90 (DRAPE) ×2 IMPLANT
DRAPE OEC MINIVIEW 54X84 (DRAPES) IMPLANT
DRAPE SURG 17X23 STRL (DRAPES) ×2 IMPLANT
DURAPREP 26ML APPLICATOR (WOUND CARE) ×2 IMPLANT
GAUZE SPONGE 4X4 12PLY STRL (GAUZE/BANDAGES/DRESSINGS) ×2 IMPLANT
GAUZE SPONGE 4X4 16PLY XRAY LF (GAUZE/BANDAGES/DRESSINGS) IMPLANT
GAUZE XEROFORM 1X8 LF (GAUZE/BANDAGES/DRESSINGS) ×2 IMPLANT
GLOVE BIO SURGEON STRL SZ 6.5 (GLOVE) ×2 IMPLANT
GLOVE BIOGEL PI IND STRL 7.0 (GLOVE) ×1 IMPLANT
GLOVE BIOGEL PI INDICATOR 7.0 (GLOVE) ×1
GLOVE SURG SYN 8.0 (GLOVE) ×4 IMPLANT
GOWN STRL REUS W/ TWL LRG LVL3 (GOWN DISPOSABLE) ×1 IMPLANT
GOWN STRL REUS W/TWL LRG LVL3 (GOWN DISPOSABLE) ×2
GOWN STRL REUS W/TWL XL LVL3 (GOWN DISPOSABLE) ×2 IMPLANT
NEEDLE HYPO 25X1 1.5 SAFETY (NEEDLE) ×2 IMPLANT
NS IRRIG 1000ML POUR BTL (IV SOLUTION) ×2 IMPLANT
PACK BASIN DAY SURGERY FS (CUSTOM PROCEDURE TRAY) ×2 IMPLANT
PAD CAST 4YDX4 CTTN HI CHSV (CAST SUPPLIES) ×1 IMPLANT
PADDING CAST ABS 4INX4YD NS (CAST SUPPLIES) ×1
PADDING CAST ABS COTTON 4X4 ST (CAST SUPPLIES) ×1 IMPLANT
PADDING CAST COTTON 4X4 STRL (CAST SUPPLIES) ×2
PADDING UNDERCAST 2 STRL (CAST SUPPLIES)
PADDING UNDERCAST 2X4 STRL (CAST SUPPLIES) IMPLANT
SHEET MEDIUM DRAPE 40X70 STRL (DRAPES) ×2 IMPLANT
SLEEVE SCD COMPRESS KNEE MED (MISCELLANEOUS) IMPLANT
SPLINT PLASTER CAST XFAST 4X15 (CAST SUPPLIES) ×10 IMPLANT
SPLINT PLASTER XTRA FAST SET 4 (CAST SUPPLIES) ×10
STOCKINETTE 4X48 STRL (DRAPES) ×2 IMPLANT
STRIP CLOSURE SKIN 1/2X4 (GAUZE/BANDAGES/DRESSINGS) IMPLANT
SUT ETHIBOND 3-0 V-5 (SUTURE) IMPLANT
SUT ETHILON 3 0 PS 1 (SUTURE) IMPLANT
SUT ETHILON 4 0 PS 2 18 (SUTURE) IMPLANT
SUT ETHILON 5 0 PS 2 18 (SUTURE) IMPLANT
SUT MON AB 4-0 PC3 18 (SUTURE) IMPLANT
SUT PROLENE 3 0 PS 2 (SUTURE) ×2 IMPLANT
SUT PROLENE 6 0 PC 1 (SUTURE) IMPLANT
SUT SILK 4 0 PS 2 (SUTURE) IMPLANT
SUT VIC AB 0 CT3 27 (SUTURE) IMPLANT
SUT VIC AB 0 SH 27 (SUTURE) IMPLANT
SUT VIC AB 2-0 SH 27 (SUTURE)
SUT VIC AB 2-0 SH 27XBRD (SUTURE) IMPLANT
SUT VIC AB 3-0 PS1 18 (SUTURE)
SUT VIC AB 3-0 PS1 18XBRD (SUTURE) IMPLANT
SUT VIC AB 4-0 P-3 18XBRD (SUTURE) ×1 IMPLANT
SUT VIC AB 4-0 P3 18 (SUTURE) ×2
SUT VICRYL 4-0 PS2 18IN ABS (SUTURE) IMPLANT
SUT VICRYL RAPIDE 4-0 (SUTURE) IMPLANT
SUT VICRYL RAPIDE 4/0 PS 2 (SUTURE) IMPLANT
SYR BULB 3OZ (MISCELLANEOUS) ×2 IMPLANT
SYRINGE 10CC LL (SYRINGE) ×2 IMPLANT
TOWEL OR 17X24 6PK STRL BLUE (TOWEL DISPOSABLE) ×4 IMPLANT
TRAY DSU PREP LF (CUSTOM PROCEDURE TRAY) IMPLANT
UNDERPAD 30X30 (UNDERPADS AND DIAPERS) ×2 IMPLANT

## 2015-02-19 NOTE — Anesthesia Postprocedure Evaluation (Signed)
Anesthesia Post Note  Patient: Greg Stokes  Procedure(s) Performed: Procedure(s) (LRB): REMOVAL PLATE/SCREWS WITH CAPSULECTOMY (Left) TENOLYSIS LEFT SMALL FINGER METACARPAL  (Left)  Anesthesia type: General  Patient location: PACU  Post pain: Pain level controlled  Post assessment: Post-op Vital signs reviewed  Last Vitals: BP 155/98 mmHg  Pulse 73  Temp(Src) 36.4 C (Oral)  Resp 18  Ht 5\' 11"  (1.803 m)  Wt 199 lb 9.6 oz (90.538 kg)  BMI 27.85 kg/m2  SpO2 100%  Post vital signs: Reviewed  Level of consciousness: sedated  Complications: No apparent anesthesia complications

## 2015-02-19 NOTE — Telephone Encounter (Signed)
Patient was recently had surgery and is requesting some BP medication. He states that he has never had BP medicine prescribed here before. Should he come [facility] for an OV first[facility] [address]  [phone]

## 2015-02-19 NOTE — Transfer of Care (Signed)
Immediate Anesthesia Transfer of Care Note  Patient: Greg Stokes  Procedure(s) Performed: Procedure(s): REMOVAL PLATE/SCREWS WITH CAPSULECTOMY (Left) TENOLYSIS LEFT SMALL FINGER METACARPAL  (Left)  Patient Location: PACU  Anesthesia Type:General  Level of Consciousness: awake  Airway & Oxygen Therapy: Patient Spontanous Breathing and Patient connected to face mask oxygen  Post-op Assessment: Report given to RN and Post -op Vital signs reviewed and stable  Post vital signs: Reviewed and stable  Last Vitals:  Filed Vitals:   02/19/15 1051  BP:   Pulse: 79  Temp:   Resp: 10    Complications: No apparent anesthesia complications

## 2015-02-19 NOTE — Anesthesia Preprocedure Evaluation (Addendum)
Anesthesia Evaluation  Patient identified by MRN, date of birth, ID band Patient awake    Reviewed: Allergy & Precautions, H&P , NPO status , Patient's Chart, lab work & pertinent test results  Airway Mallampati: I  TM Distance: >3 FB Neck ROM: Full    Dental  (+) Teeth Intact, Dental Advisory Given   Pulmonary asthma (Hx as a child.  ? returning as he has a current URI) ,  breath sounds clear to auscultation        Cardiovascular negative cardio ROS  Rhythm:Regular Rate:Normal     Neuro/Psych negative neurological ROS  negative psych ROS   GI/Hepatic negative GI ROS, Neg liver ROS,   Endo/Other  negative endocrine ROS  Renal/GU negative Renal ROS     Musculoskeletal negative musculoskeletal ROS (+)   Abdominal   Peds  Hematology negative hematology ROS (+)   Anesthesia Other Findings   Reproductive/Obstetrics                             Anesthesia Physical  Anesthesia Plan  ASA: II  Anesthesia Plan: General   Post-op Pain Management:    Induction: Intravenous  Airway Management Planned: LMA  Additional Equipment:   Intra-op Plan:   Post-operative Plan: Extubation in OR  Informed Consent: I have reviewed the patients History and Physical, chart, labs and discussed the procedure including the risks, benefits and alternatives for the proposed anesthesia with the patient or authorized representative who has indicated his/her understanding and acceptance.   Dental advisory given  Plan Discussed with: CRNA, Anesthesiologist and Surgeon  Anesthesia Plan Comments:        Anesthesia Quick Evaluation

## 2015-02-19 NOTE — Op Note (Signed)
See note [id number]

## 2015-02-19 NOTE — Discharge Instructions (Signed)

## 2015-02-19 NOTE — Anesthesia Procedure Notes (Signed)
Procedure Name: LMA Insertion Date/Time: 02/19/2015 9:56 AM Performed by: Caren MacadamARTER, Bauer Ausborn W Pre-anesthesia Checklist: Patient identified, Emergency Drugs available, Suction available and Patient being monitored Patient Re-evaluated:Patient Re-evaluated prior to inductionOxygen Delivery Method: Circle System Utilized Preoxygenation: Pre-oxygenation with 100% oxygen Intubation Type: IV induction Ventilation: Mask ventilation without difficulty LMA: LMA inserted LMA Size: 5.0 Number of attempts: 1 Airway Equipment and Method: Bite block Placement Confirmation: positive ETCO2 and breath sounds checked- equal and bilateral Tube secured with: Tape Dental Injury: Teeth and Oropharynx as per pre-operative assessment

## 2015-02-19 NOTE — H&P (Signed)
Greg Stokes is an 34 y.o. male.   Chief Complaint: left small finger decreased rom HPI: as above s/p orif left small metacarpal fracture with prominent hardware  Past Medical History  Diagnosis Date  . Asthma     prn inhaler  . Retained orthopedic hardware 02/2015    left hand    Past Surgical History  Procedure Laterality Date  . Open reduction internal fixation (orif) metacarpal Left 07/28/2014    Procedure: OPEN REDUCTION INTERNAL FIXATION (ORIF) LEFT SMALL METACARPAL FRACTURE;  Surgeon: Dairl PonderMatthew Lorriann Hansmann, MD;  Location: [facility];  Service: Orthopedics;  Laterality: Left;    Family History  Problem Relation Age of Onset  . Cancer Mother   . Hyperlipidemia Father    Social History:  reports that he has never smoked. He has never used smokeless tobacco. He reports that he drinks alcohol. He reports that he does not use illicit drugs.  Allergies:  Allergies  Allergen Reactions  . Soap Rash    SCENTED SOAPS    Medications Prior to Admission  Medication Sig Dispense Refill  . predniSONE (DELTASONE) 20 MG tablet Take 2 tablets (40 mg total) by mouth daily. Take 40 mg by mouth daily for 3 days, then 20mg  by mouth daily for 3 days, then 10mg  daily for 3 days 12 tablet 0  . PROVENTIL HFA 108 (90 BASE) MCG/ACT inhaler INHALE ONE TO TWO PUFFS BY MOUTH EVERY 4 HOURS AS NEEDED FOR WHEEZING FOR SHORTNESS OF BREATH 18 each 3    No results found for this or any previous visit (from the past 48 hour(s)). No results found.  Review of Systems  All other systems reviewed and are negative.   Height 5' 11.75" (1.822 m), weight 87.998 kg (194 lb). Physical Exam  Constitutional: He is oriented to person, place, and time. He appears well-developed and well-nourished.  HENT:  Head: Normocephalic and atraumatic.  Cardiovascular: Normal rate.   Respiratory: Effort normal.  Musculoskeletal:       Left hand: He exhibits decreased range of motion and tenderness.   S/p orif left small metacarpal with decreased rom and swelling from prominent hardware  Neurological: He is alert and oriented to person, place, and time.  Skin: Skin is warm.  Psychiatric: He has a normal mood and affect. His behavior is normal. Judgment and thought content normal.     Assessment/Plan As above   Plan hardware removal and tenolysis and joint release as needed  Donyel Castagnola A 02/19/2015, 9:27 AM

## 2015-02-20 ENCOUNTER — Encounter (HOSPITAL_BASED_OUTPATIENT_CLINIC_OR_DEPARTMENT_OTHER): Payer: Self-pay | Admitting: Orthopedic Surgery

## 2015-02-20 NOTE — Telephone Encounter (Signed)
Notified pt that he needs to be seen. Pt understood.

## 2015-02-20 NOTE — Op Note (Signed)
NAME:  Greg Stokes, [name]      ACCOUNT NO.:  [id number]  MEDICAL RECORD NO.:  [id number]  LOCATION:                                FACILITY:  [facility]  PHYSICIAN:  Artist PaisMatthew A. Taitum Alms, M.D.DATE OF BIRTH:  08/05/1981  DATE OF PROCEDURE:  02/19/2015 DATE OF DISCHARGE:  02/19/2015                              OPERATIVE REPORT   PREOPERATIVE DIAGNOSIS:  Retained painful hardware, left small finger, status post open reduction and internal fixation, intra-articular fracture, metacarpal head and neck junction.  POSTOPERATIVE DIAGNOSIS:  Retained painful hardware, left small finger, status post open reduction and internal fixation, intra-articular fracture, metacarpal head and neck junction.  PROCEDURE:  Hardware removal, left small finger with capsulectomy, metacarpophalangeal joint, joint manipulation, and extensor tenolysis of the EDC and EDQ, small finger, left hand.  SURGEON:  Artist PaisMatthew A. Mina MarbleWeingold, M.D.  ASSISTANT:  None.  ANESTHESIA:  General.  COMPLICATIONS:  None.  DRAINS:  None.  DESCRIPTION OF PROCEDURE:  The patient was taken the operating suite. After induction of general anesthetic, left upper extremity was prepped and draped in sterile fashion.  An Esmarch was used to exsanguinate the limb.  Tourniquet was inflated to 250 mmHg.  At this point in time, I used to previous incision of the dorsal ulnar aspect of the thumb, metacarpal, head and neck junction area of the left hand.  Skin was incised sharply.  Dissection was carried down to the extensor digitorum communis and extensor digitorum minimi to the small finger.  These were encased in a fair amount of scarring, very carefully we tenolysed to the level of metacarpophalangeal joint.  We then used this interval to dissect down to the plate and 2 screws that were placed outside the plate were removed with no undue difficulty.  The wound was thoroughly irrigated.  We then used a Therapist, nutritionalreer elevator to gently incise and  elevate the dorsal capsule.  We then gently manipulated the metacarpophalangeal and a full flexion and full extension. The wound was thoroughly irrigated and loosely closed in layers of 4-0 Vicryl to close the capsule and 3-0 Prolene horizontal mattress sutures x6 to close the skin.  Xeroform, 4x4s, and compressive dressing were applied.  The patient tolerated the procedure well and went to recovery room in stable fashion.     Artist PaisMatthew A. Mina MarbleWeingold, M.D.     MAW/MEDQ  D:  02/19/2015  T:  02/19/2015  Job:  [id number]

## 2015-02-21 ENCOUNTER — Encounter: Payer: Self-pay | Admitting: Family Medicine

## 2015-02-21 ENCOUNTER — Ambulatory Visit (INDEPENDENT_AMBULATORY_CARE_PROVIDER_SITE_OTHER): Payer: 59 | Admitting: Family Medicine

## 2015-02-21 VITALS — BP 160/100 | HR 78 | Temp 97.9°F | Resp 16 | Ht 71.5 in | Wt 209.6 lb

## 2015-02-21 DIAGNOSIS — R9431 Abnormal electrocardiogram [ECG] [EKG]: Secondary | ICD-10-CM | POA: Diagnosis not present

## 2015-02-21 DIAGNOSIS — I10 Essential (primary) hypertension: Secondary | ICD-10-CM

## 2015-02-21 LAB — COMPREHENSIVE METABOLIC PANEL
ALT: 26 U/L (ref 0–53)
AST: 16 U/L (ref 0–37)
Albumin: 4.7 g/dL (ref 3.5–5.2)
Alkaline Phosphatase: 38 U/L — ABNORMAL LOW (ref 39–117)
BUN: 13 mg/dL (ref 6–23)
CO2: 29 mEq/L (ref 19–32)
Calcium: 9.2 mg/dL (ref 8.4–10.5)
Chloride: 101 mEq/L (ref 96–112)
Creat: 0.88 mg/dL (ref 0.50–1.35)
Glucose, Bld: 111 mg/dL — ABNORMAL HIGH (ref 70–99)
Potassium: 4 mEq/L (ref 3.5–5.3)
Sodium: 138 mEq/L (ref 135–145)
Total Bilirubin: 0.4 mg/dL (ref 0.2–1.2)
Total Protein: 8.3 g/dL (ref 6.0–8.3)

## 2015-02-21 LAB — LIPID PANEL
Cholesterol: 202 mg/dL — ABNORMAL HIGH (ref 0–200)
HDL: 54 mg/dL (ref >=40)
LDL Cholesterol: 132 mg/dL — ABNORMAL HIGH (ref 0–99)
TRIGLYCERIDES: 82 mg/dL (ref <150)
Total CHOL/HDL Ratio: 3.7 Ratio
VLDL: 16 mg/dL (ref 0–40)

## 2015-02-21 MED ORDER — LISINOPRIL 10 MG PO TABS: 10.0000 mg | ORAL_TABLET | Freq: Every day | ORAL | Status: DC

## 2015-02-21 NOTE — Progress Notes (Signed)
[facility] [address], [place] [address] [phone]  Date:  02/21/2015   Name:  Greg GellWilliam Hubbard-Mclear   DOB:  04-12-1981   MRN:  [id number]  PCP:  Tonye PearsonOLITTLE, ROBERT P, MD    Chief Complaint: Hypertension   History of Present Illness:  Greg Stokes is a 34 y.o. very pleasant male patient who presents with the following:  Here today to follow-up on HTN. He had surgery on his left hand 07/2014- he was noted to have elevated BP at that time and it has continued to run a little high. His mother encouraged him to keep an eye on this so he came in to be seen today.  He has never been treated for HTN in the past. There is a family history of HTN- his mother has HTN.   He is otherwise in good health.   He uses his inhaler as needed for wheezing- may use 2x a week.  He has not been able to lift weights much recently due to his hand injury and surgery.  He does cardio, martial arts and boxing.  Right now he just does cardio,  No issues with chest pain or SOB with exercise.   He is not aware of any family history of CAD, except his grandfather may had had an MI in his 3080s.  He is a never smoker He had some cereal this am, OW fasting  BP Readings from Last 3 Encounters:  02/21/15 160/100  02/19/15 155/98  02/10/15 159/95     There are no active problems to display for this patient.   Past Medical History  Diagnosis Date  . Asthma     prn inhaler  . Retained orthopedic hardware 02/2015    left hand    Past Surgical History  Procedure Laterality Date  . Open reduction internal fixation (orif) metacarpal Left 07/28/2014    Procedure: OPEN REDUCTION INTERNAL FIXATION (ORIF) LEFT SMALL METACARPAL FRACTURE;  Surgeon: Dairl PonderMatthew Weingold, MD;  Location: [facility];  Service: Orthopedics;  Laterality: Left;  . Hardware removal Left 02/19/2015    Procedure: REMOVAL PLATE/SCREWS WITH CAPSULECTOMY;  Surgeon: Dairl PonderMatthew Weingold, MD;  Location:  [facility];  Service: Orthopedics;  Laterality: Left;  . Tenolysis Left 02/19/2015    Procedure: TENOLYSIS LEFT SMALL FINGER METACARPAL ;  Surgeon: Dairl PonderMatthew Weingold, MD;  Location: [facility];  Service: Orthopedics;  Laterality: Left;    History  Substance Use Topics  . Smoking status: Never Smoker   . Smokeless tobacco: Never Used  . Alcohol Use: Yes     Comment: 1 x/week    Family History  Problem Relation Age of Onset  . Cancer Mother   . Hyperlipidemia Father     Allergies  Allergen Reactions  . Soap Rash    SCENTED SOAPS    Medication list has been reviewed and updated.  Current Outpatient Prescriptions on File Prior to Visit  Medication Sig Dispense Refill  . oxyCODONE-acetaminophen (ROXICET) 5-325 MG per tablet Take 1 tablet by mouth every 4 (four) hours as needed for severe pain. 30 tablet 0  . PROVENTIL HFA 108 (90 BASE) MCG/ACT inhaler INHALE ONE TO TWO PUFFS BY MOUTH EVERY 4 HOURS AS NEEDED FOR WHEEZING FOR SHORTNESS OF BREATH 18 each 3  . predniSONE (DELTASONE) 20 MG tablet Take 2 tablets (40 mg total) by mouth daily. Take 40 mg by mouth daily for 3 days, then 20mg  by mouth daily for 3 days,  then  daily for 3 days (Patient not taking: Reported on 02/21/2015) 12 tablet 0   No current facility-administered medications on file prior to visit.    Review of Systems:  As per HPI- otherwise negative.   Physical Examination: Filed Vitals:   02/21/15 0907  BP: 160/100  Pulse: 78  Temp: 97.9 F (36.6 C)  Resp: 16   Filed Vitals:   02/21/15 0907  Height: 5' 11.5" (1.816 m)  Weight: 209 lb 9.6 oz (95.074 kg)   Body mass index is 28.83 kg/(m^2). Ideal Body Weight: Weight in (lb) to have BMI = 25: 181.4  GEN: WDWN, NAD, Non-toxic, A & O x 3, muscular build, looks well HEENT: Atraumatic, Normocephalic. Neck supple. No masses, No LAD. Ears and Nose: No external deformity. CV: RRR, No M/G/R. No JVD. No thrill. No extra heart  sounds. PULM: CTA B, no wheezes, crackles, rhonchi. No retractions. No resp. distress. No accessory muscle use. ABD: S, NT, ND. No rebound. No HSM. EXTR: No c/c/e NEURO Normal gait.  PSYCH: Normally interactive. Conversant. Not depressed or anxious appearing.  Calm demeanor.   EKG:  SR, non -specific T wave change  Results for orders placed or performed in visit on 02/21/15  Lipid panel  Result Value Ref Range   Cholesterol 202 (H) 0 - 200 mg/dL   Triglycerides 82 <161 mg/dL   HDL 54 >=09 mg/dL   Total CHOL/HDL Ratio 3.7 Ratio   VLDL 16 0 - 40 mg/dL   LDL Cholesterol 604 (H) 0 - 99 mg/dL  Comprehensive metabolic panel  Result Value Ref Range   Sodium 138 135 - 145 mEq/L   Potassium 4.0 3.5 - 5.3 mEq/L   Chloride 101 96 - 112 mEq/L   CO2 29 19 - 32 mEq/L   Glucose, Bld 111 (H) 70 - 99 mg/dL   BUN 13 6 - 23 mg/dL   Creat 5.40 9.81 - 1.91 mg/dL   Total Bilirubin 0.4 0.2 - 1.2 mg/dL   Alkaline Phosphatase 38 (L) 39 - 117 U/L   AST 16 0 - 37 U/L   ALT 26 0 - 53 U/L   Total Protein 8.3 6.0 - 8.3 g/dL   Albumin 4.7 3.5 - 5.2 g/dL   Calcium 9.2 8.4 - 47.8 mg/dL    Assessment and Plan: Essential hypertension - Plan: EKG 12-Lead, Lipid panel, Comprehensive metabolic panel, lisinopril (PRINIVIL,ZESTRIL) 10 MG tablet  Nonspecific abnormal electrocardiogram (ECG) (EKG) - Plan: Ambulatory referral to Cardiology  He does have elevated BP which has been consistent over several readings.  Start on lisionpril 10.  Given his young age and abnormal EKG will refer for a consultation with cardiology as well. Follow-up pending labs   BP Readings from Last 3 Encounters:  02/21/15 160/100  02/19/15 155/98  02/10/15 159/95     Signed Abbe Amsterdam, MD

## 2015-02-21 NOTE — Patient Instructions (Addendum)
I will be in touch with your labs asap Please start on lisinopril 10mg  once a day.   I will refer you to cardiology as your EKG is slightly abnormal; they may want to do an echocardiogram to check your heart muscle function Please see us in about 4 weeks for a recheck (unless you just saw cardiology); if you could check your BP a few times in between that would be helpful

## 2015-03-14 ENCOUNTER — Telehealth: Payer: Self-pay

## 2015-03-14 ENCOUNTER — Other Ambulatory Visit: Payer: Self-pay

## 2015-03-14 MED ORDER — ALBUTEROL SULFATE HFA 108 (90 BASE) MCG/ACT IN AERS: 1.0000 | INHALATION_SPRAY | RESPIRATORY_TRACT | Status: DC | PRN

## 2015-03-14 NOTE — Telephone Encounter (Signed)
Spoke with pt, advised to RTC to get refill on the Prednisone. Advised Rx for inhaler sent. Pt understood.

## 2015-03-14 NOTE — Telephone Encounter (Signed)
Patient is calling to request a refill for Proventil inhaler and prednisone. Pharm[address] [facility] [address] Patient phone: [phone]

## 2015-03-30 ENCOUNTER — Ambulatory Visit (INDEPENDENT_AMBULATORY_CARE_PROVIDER_SITE_OTHER): Payer: 59 | Admitting: Family Medicine

## 2015-03-30 VITALS — BP 169/106 | HR 75 | Temp 98.1°F | Resp 18 | Ht 70.0 in | Wt 203.2 lb

## 2015-03-30 DIAGNOSIS — Z888 Allergy status to other drugs, medicaments and biological substances status: Secondary | ICD-10-CM | POA: Diagnosis not present

## 2015-03-30 DIAGNOSIS — I10 Essential (primary) hypertension: Secondary | ICD-10-CM

## 2015-03-30 MED ORDER — AMLODIPINE BESYLATE 5 MG PO TABS
ORAL_TABLET | ORAL | Status: DC
Start: 2015-03-30 — End: 2015-05-14

## 2015-03-30 NOTE — Patient Instructions (Signed)
Take the amlodipine 5 mg daily for 1-2 weeks, then increase to 10 mg daily (it all can be taken at the same time).  Get your blood pressure checked at a pharmacy or somewhere once or twice a week and write down the numbers. The goal is to be less than 140/90, ideally down around 120/70.  Avoid excessive salt and get regular exercise  Discontinue the lisinopril and make a note in your personal records that you may be allergic to this  Return in about 4-6 weeks to get your blood pressure rechecked.

## 2015-03-30 NOTE — Progress Notes (Signed)
  Subjective:  Patient ID: Greg Stokes, male    DOB: May 25, 1981  Age: 34 y.o. MRN: [id number]  34 year old man whom I saw several months ago. He had subsequently seen Dr. Dallas Schimkeopeland several times in was placed on lisinopril last visit. He did okay for about the first 3 weeks of the medication, but then he started getting a sore throat. He started in on the new month pack and continue to have problems with the throat being sore and some coughing. He had some runny nose. He thought he might be allergic to the lisinopril so he came in today. His last dose was yesterday morning. He did take a Benadryl a couple times a day.   Objective:   Healthy appearing gentleman. His TMs are normal on the right, some cerumen on the left. Throat has some edema of the uvula, about 1-1/2-2 times normal size. Neck supple without nodes. Chest clear. Heart regular without murmurs.  Assessment & Plan:   Assessment: Allergy to lisinopril Hypertension Uvula edema  Plan:  I do not think this is going to get worse at this point, since he's been off the medicine for a day and a half. However in the event of any difficulty breathing he is to call 911 and or go straight to the emergency room. He is to continue taking his Benadryl about 3 times a day for the next couple of days.  Patient Instructions  Take the amlodipine 5 mg daily for 1-2 weeks, then increase to 10 mg daily (it all can be taken at the same time).  Get your blood pressure checked at a pharmacy or somewhere once or twice a week and write down the numbers. The goal is to be less than 140/90, ideally down around 120/70.  Avoid excessive salt and get regular exercise  Discontinue the lisinopril and make a note in your personal records that you may be allergic to this  Return in about 4-6 weeks to get your blood pressure rechecked.    Richardine Peppers, MD 03/30/2015

## 2015-04-12 ENCOUNTER — Ambulatory Visit (INDEPENDENT_AMBULATORY_CARE_PROVIDER_SITE_OTHER): Payer: 59 | Admitting: Physician Assistant

## 2015-04-12 VITALS — BP 130/80 | HR 98 | Temp 98.0°F | Resp 18 | Ht 70.0 in | Wt 201.0 lb

## 2015-04-12 DIAGNOSIS — J4541 Moderate persistent asthma with (acute) exacerbation: Secondary | ICD-10-CM | POA: Diagnosis not present

## 2015-04-12 DIAGNOSIS — R030 Elevated blood-pressure reading, without diagnosis of hypertension: Secondary | ICD-10-CM

## 2015-04-12 DIAGNOSIS — J309 Allergic rhinitis, unspecified: Secondary | ICD-10-CM

## 2015-04-12 DIAGNOSIS — IMO0001 Reserved for inherently not codable concepts without codable children: Secondary | ICD-10-CM

## 2015-04-12 MED ORDER — BECLOMETHASONE DIPROPIONATE 40 MCG/ACT IN AERS: 1.0000 | INHALATION_SPRAY | Freq: Two times a day (BID) | RESPIRATORY_TRACT | Status: DC

## 2015-04-12 MED ORDER — ALBUTEROL SULFATE (2.5 MG/3ML) 0.083% IN NEBU: 2.5000 mg | INHALATION_SOLUTION | Freq: Four times a day (QID) | RESPIRATORY_TRACT | Status: DC | PRN

## 2015-04-12 NOTE — Patient Instructions (Signed)
Use qvar inhaler twice a day. Ask pharmacist to show you how to use. Use albuterol as needed for shortness of breath, wheezing, chest tightness. Start taking claritin or zyrtec daily. Return in 3 months for follow up.

## 2015-04-12 NOTE — Progress Notes (Addendum)
[facility] [address], [place] [address] [phone]  Date:  04/12/2015   Name:  Greg Stokes   DOB:  1980/12/31   MRN:  [id number]  PCP:  Tonye Pearson, MD    Chief Complaint: Wheezing; Shortness of Breath; and Cough   History of Present Illness:  This is a 34 y.o. male with PMH asthma and allergic rhinitis who is presenting stating he needs a nebulizer machine. Pt states he has had problems with asthma on and off for the past 23 years. He was born in [place] and did not have problems until [place] the [place]. States when he turned 15 he stopped needing albuterol and did not need again until college when he was playing sports. He would use only during sports. After college he did not need albuterol again until 1 year ago. Over the past 6 months ever since surgery to repair a boxers fracture, he has had worsening asthma. He reports 8 months ago they moved to a new house. He is wondering if he is allergic to something in the house. He dusts the house every 2 days. He does have a problem with allergies but does not take anything regularly. He will occ take claritin which does help. Pt currently is using albuterol every morning and wakes up 3 times a week with symptoms and needing to use his inhaler. He has never been on a maintenance inhaler. He has been to the hospital 2 times in the past 6 months with asthma exacerbation and needing prednisone taper and breathing treatments.   Pt was placed on lisinopril for elevated BP 1.5 months ago. He experience angioedema and stopped. He is not on anything currently and BP stable at 130/84. Pt states BP has always been around 130/80s except for the past 8 months since asthma was worsened his BP has gone up.  Review of Systems:  Review of Systems See HPI  There are no active problems to display for this patient.   Prior to Admission medications   Medication Sig Start Date End Date Taking?  Authorizing Provider  albuterol (PROVENTIL HFA;VENTOLIN HFA) 108 (90 BASE) MCG/ACT inhaler Inhale 1-2 puffs into the lungs every 4 (four) hours as needed. 03/14/15  Yes Todd McVeigh, PA  amLODipine (NORVASC) 5 MG tablet Take 5 mg daily for 1 week, then increase to 10 mg 03/30/15  Yes Peyton Najjar, MD  triamcinolone (KENALOG) 0.025 % cream Apply 1 application topically 2 (two) times daily.   Yes Historical Provider, MD           Allergies  Allergen Reactions  . Lisinopril Swelling  . Soap Rash    SCENTED SOAPS    Past Surgical History  Procedure Laterality Date  . Open reduction internal fixation (orif) metacarpal Left 07/28/2014    Procedure: OPEN REDUCTION INTERNAL FIXATION (ORIF) LEFT SMALL METACARPAL FRACTURE;  Surgeon: Dairl Ponder, MD;  Location: [facility];  Service: Orthopedics;  Laterality: Left;  . Hardware removal Left 02/19/2015    Procedure: REMOVAL PLATE/SCREWS WITH CAPSULECTOMY;  Surgeon: Dairl Ponder, MD;  Location: [facility];  Service: Orthopedics;  Laterality: Left;  . Tenolysis Left 02/19/2015    Procedure: TENOLYSIS LEFT SMALL FINGER METACARPAL ;  Surgeon: Dairl Ponder, MD;  Location: [facility];  Service: Orthopedics;  Laterality: Left;    History  Substance Use Topics  . Smoking status: Never Smoker   . Smokeless tobacco: Never  Used  . Alcohol Use: Yes     Comment: 1 x/week    Family History  Problem Relation Age of Onset  . Cancer Mother   . Hyperlipidemia Father     Medication list has been reviewed and updated.  Physical Examination:  Physical Exam  Constitutional: He is oriented to person, place, and time. He appears well-developed and well-nourished. No distress.  HENT:  Head: Normocephalic and atraumatic.  Right Ear: Hearing, tympanic membrane, external ear and ear canal normal.  Left Ear: Hearing, tympanic membrane, external ear and ear canal normal.  Mouth/Throat: Uvula is midline,  oropharynx is clear and moist and mucous membranes are normal.  Nasal mucosa pale  Eyes: Conjunctivae and lids are normal. Right eye exhibits no discharge. Left eye exhibits no discharge. No scleral icterus.  Cardiovascular: Normal rate, regular rhythm, normal heart sounds and normal pulses.   No murmur heard. Pulmonary/Chest: Effort normal. No respiratory distress. He has decreased breath sounds (mildly, throughout). He has no wheezes. He has no rhonchi. He has no rales.  Musculoskeletal: Normal range of motion.  Lymphadenopathy:       Head (right side): No submental, no submandibular and no tonsillar adenopathy present.       Head (left side): No submental, no submandibular and no tonsillar adenopathy present.    He has no cervical adenopathy.  Neurological: He is alert and oriented to person, place, and time.  Skin: Skin is warm, dry and intact. No lesion and no rash noted.  Psychiatric: He has a normal mood and affect. His speech is normal and behavior is normal. Thought content normal.   BP 130/80 mmHg  Pulse 98  Temp(Src) 98 F (36.7 C) (Oral)  Resp 18  Ht  (1.778 m)  Wt 201 lb (91.173 kg)  BMI 28.84 kg/m2  SpO2 98%  Assessment and Plan:  1. Asthma with acute exacerbation, moderate persistent 2. Allergic rhinitis Daily symptoms and night time symptoms 3 nights a week classifies him as moderate persistent asthma. Will start qvar BID. Neb or inhaler as needed for SOB/wheezing/chest tightness. Advised he start taking claritin daily for allergies as likely triggering his asthma sx. Return in 3 months for follow up. - beclomethasone (QVAR) 40 MCG/ACT inhaler; Inhale 1 puff into the lungs 2 (two) times daily.  Dispense: 1 Inhaler; Refill: 12 - albuterol (PROVENTIL) (2.5 MG/3ML) 0.083% nebulizer solution; Take 3 mLs (2.5 mg total) by nebulization every 6 (six) hours as needed for wheezing or shortness of breath.  Dispense: 75 mL; Refill: 12  3. Elevated BP Pt with stable BP  currently at 130/80 without meds. Will continue to monitor. Possibly with asthma under better control his BP will also be under better control. Return in 3 months for follow up.    Roswell Miners Dyke Brackett, MHS [facility]  04/13/2015

## 2015-04-13 DIAGNOSIS — J45901 Unspecified asthma with (acute) exacerbation: Secondary | ICD-10-CM | POA: Insufficient documentation

## 2015-04-13 DIAGNOSIS — J309 Allergic rhinitis, unspecified: Secondary | ICD-10-CM | POA: Insufficient documentation

## 2015-04-21 ENCOUNTER — Ambulatory Visit (INDEPENDENT_AMBULATORY_CARE_PROVIDER_SITE_OTHER): Payer: 59 | Admitting: Family Medicine

## 2015-04-21 VITALS — BP 132/86 | HR 88 | Temp 98.5°F | Ht 71.0 in | Wt 204.0 lb

## 2015-04-21 DIAGNOSIS — K047 Periapical abscess without sinus: Secondary | ICD-10-CM

## 2015-04-21 DIAGNOSIS — K0889 Other specified disorders of teeth and supporting structures: Secondary | ICD-10-CM

## 2015-04-21 DIAGNOSIS — K088 Other specified disorders of teeth and supporting structures: Secondary | ICD-10-CM

## 2015-04-21 MED ORDER — HYDROCODONE-ACETAMINOPHEN 5-325 MG PO TABS: 1.0000 | ORAL_TABLET | Freq: Three times a day (TID) | ORAL | Status: DC | PRN

## 2015-04-21 NOTE — Progress Notes (Signed)
Chief Complaint:  Chief Complaint  Patient presents with  . Dental Injury    c/o tooth pain due to cracked tooth and would like something for pain    HPI: Greg Stokes is a 34 y.o. male who reports to [facility] today complaining of dental pain  At the site of his cracked tooth since Decemebr, he cannot drink or eat without pain, the area is swollen, his dentist put  Him on abx but did not call him in a narcotic. He has appt in 2 weeks, Dr Lorin Picket Rem oral surgeon of [facility]. He has seen him before but could not afford to do anything until get dental insurance. Denies  fevers or chioills. Gso orla impalnt. No weird taste in his mouth. He has not been  able tot eat and drink on that side. Does not chew tobacco. He now has dental insurance , he broke it playing football because  did not have a mouth piece. He has  8/10 pain most of the time, bothers him most when he is sleeping nad eatng. .   Past Medical History  Diagnosis Date  . Asthma     prn inhaler  . Retained orthopedic hardware 02/2015    left hand   Past Surgical History  Procedure Laterality Date  . Open reduction internal fixation (orif) metacarpal Left 07/28/2014    Procedure: OPEN REDUCTION INTERNAL FIXATION (ORIF) LEFT SMALL METACARPAL FRACTURE;  Surgeon: Dairl Ponder, MD;  Location: [facility];  Service: Orthopedics;  Laterality: Left;  . Hardware removal Left 02/19/2015    Procedure: REMOVAL PLATE/SCREWS WITH CAPSULECTOMY;  Surgeon: Dairl Ponder, MD;  Location: [facility];  Service: Orthopedics;  Laterality: Left;  . Tenolysis Left 02/19/2015    Procedure: TENOLYSIS LEFT SMALL FINGER METACARPAL ;  Surgeon: Dairl Ponder, MD;  Location: [facility];  Service: Orthopedics;  Laterality: Left;   History   Social History  . Marital Status: Single    Spouse Name: N/A  . Number of Children: N/A  . Years of Education: N/A   Social History Main Topics  . Smoking  status: Never Smoker   . Smokeless tobacco: Never Used  . Alcohol Use: Yes     Comment: 1 x/week  . Drug Use: No  . Sexual Activity: Yes   Other Topics Concern  . None   Social History Narrative   Family History  Problem Relation Age of Onset  . Cancer Mother   . Hyperlipidemia Father    Allergies  Allergen Reactions  . Lisinopril Swelling  . Soap Rash    SCENTED SOAPS   Prior to Admission medications   Medication Sig Start Date End Date Taking? Authorizing Provider  albuterol (PROVENTIL HFA;VENTOLIN HFA) 108 (90 BASE) MCG/ACT inhaler Inhale 1-2 puffs into the lungs every 4 (four) hours as needed. 03/14/15  Yes Todd McVeigh, PA  albuterol (PROVENTIL) (2.5 MG/3ML) 0.083% nebulizer solution Take 3 mLs (2.5 mg total) by nebulization every 6 (six) hours as needed for wheezing or shortness of breath. 04/12/15  Yes Lanier Clam V, PA-C  amLODipine (NORVASC) 5 MG tablet Take 5 mg daily for 1 week, then increase to 10 mg 03/30/15  Yes Peyton Najjar, MD  beclomethasone (QVAR) 40 MCG/ACT inhaler Inhale 1 puff into the lungs 2 (two) times daily. Patient not taking: Reported on 04/21/2015 04/12/15   Dorna Leitz, PA-C  triamcinolone (KENALOG) 0.025 % cream Apply 1 application topically 2 (two) times daily.  Historical Provider, MD     ROS: The patient denies fevers, chills, night sweats, unintentional weight loss, chest pain, palpitations, wheezing, dyspnea on exertion, nausea, vomiting, abdominal pain, dysuria, hematuria, melena, numbness, weakness, or tingling.   All other systems have been reviewed and were otherwise negative with the exception of those mentioned in the HPI and as above.    PHYSICAL EXAM: Filed Vitals:   04/21/15 1009  BP: 132/86  Pulse: 88  Temp: 98.5 F (36.9 C)   Body mass index is 28.46 kg/(m^2).   General: Alert, no acute distress HEENT:  Normocephalic, atraumatic, oropharynx patent. EOMI, PERRLA + left gingival swelling and broken tooth  Cardiovascular:   Regular rate and rhythm, no rubs murmurs or gallops.   Respiratory: Clear to auscultation bilaterally.  No wheezes, rales, or rhonchi.  No cyanosis, no use of accessory musculature Abdominal: No organomegaly, abdomen is soft and non-tender, positive bowel sounds. No masses. Skin: No rashes. Neurologic: Facial musculature symmetric. Psychiatric: Patient acts appropriately throughout our interaction. Lymphatic: No cervical or submandibular lymphadenopathy Musculoskeletal: Gait intact.    LABS: Results for orders placed or performed in visit on 02/21/15  Lipid panel  Result Value Ref Range   Cholesterol 202 (H) 0 - 200 mg/dL   Triglycerides 82 <161 mg/dL   HDL 54 >=09 mg/dL   Total CHOL/HDL Ratio 3.7 Ratio   VLDL 16 0 - 40 mg/dL   LDL Cholesterol 604 (H) 0 - 99 mg/dL  Comprehensive metabolic panel  Result Value Ref Range   Sodium 138 135 - 145 mEq/L   Potassium 4.0 3.5 - 5.3 mEq/L   Chloride 101 96 - 112 mEq/L   CO2 29 19 - 32 mEq/L   Glucose, Bld 111 (H) 70 - 99 mg/dL   BUN 13 6 - 23 mg/dL   Creat 5.40 9.81 - 1.91 mg/dL   Total Bilirubin 0.4 0.2 - 1.2 mg/dL   Alkaline Phosphatase 38 (L) 39 - 117 U/L   AST 16 0 - 37 U/L   ALT 26 0 - 53 U/L   Total Protein 8.3 6.0 - 8.3 g/dL   Albumin 4.7 3.5 - 5.2 g/dL   Calcium 9.2 8.4 - 47.8 mg/dL     EKG/XRAY:   Primary read interpreted by Dr. Conley Rolls at [facility].   ASSESSMENT/PLAN: Encounter Diagnoses  Name Primary?  . Dental abscess Yes  . Pain, dental    [place] CSDB pulled, no illegal activities Rx norco , cont with amox   Gross sideeffects, risk and benefits, and alternatives of medications d/w patient. Patient is aware that all medications have potential sideeffects and we are unable to predict every sideeffect or drug-drug interaction that may occur.  Thao Le DO  04/21/2015 11:06 AM

## 2015-04-29 NOTE — Progress Notes (Signed)
Patient ID: Greg Stokes, male   DOB: Aug 09, 1981, 34 y.o.   MRN: [id number]     Cardiology Office Note   Date:  04/30/2015   ID:  Maximiliano Cromartie, DOB [birth year], MRN [id number]  PCP:  Janace Hoard, MD  Cardiologist:   Charlton Haws, MD   No chief complaint on file.     History of Present Illness: Greg Stokes is a 34 y.o. male who presents for evaluation of HTN and elevated BP.  Seen by Dr Dallas Schimke 02/21/15 and started on ACE.  Took this for about a month but developed a sensation of swelling in the back of his throat and it was stopped.  Has been taking norvasc 10 mg since.  He has some reactive airway disease since being in [place].  Did not have in in [place] , [place] or [place]  He does security work an Cytogeneticist.  Denies steroid use, stimulants, drugs.  Does not smoke  Does not actively wheeze but does have a PRN inhaler he last used this am.  No history of CRF, or kidney disease  No connective tissue disease and no arthritis.  Currently feels well on norvasc and occasional BP readings at [facility] have been normal half the time and mildly elevated the other half   Does it some salt with ethinic food.  Denies excess ETOH     Past Medical History  Diagnosis Date  . Asthma     prn inhaler  . Retained orthopedic hardware 02/2015    left hand    Past Surgical History  Procedure Laterality Date  . Open reduction internal fixation (orif) metacarpal Left 07/28/2014    Procedure: OPEN REDUCTION INTERNAL FIXATION (ORIF) LEFT SMALL METACARPAL FRACTURE;  Surgeon: Dairl Ponder, MD;  Location: [facility];  Service: Orthopedics;  Laterality: Left;  . Hardware removal Left 02/19/2015    Procedure: REMOVAL PLATE/SCREWS WITH CAPSULECTOMY;  Surgeon: Dairl Ponder, MD;  Location: [facility];  Service: Orthopedics;  Laterality: Left;  . Tenolysis Left 02/19/2015    Procedure: TENOLYSIS LEFT SMALL FINGER METACARPAL ;  Surgeon: Dairl Ponder,  MD;  Location: [facility];  Service: Orthopedics;  Laterality: Left;     Current Outpatient Prescriptions  Medication Sig Dispense Refill  . albuterol (PROVENTIL HFA;VENTOLIN HFA) 108 (90 BASE) MCG/ACT inhaler Inhale 1-2 puffs into the lungs every 4 (four) hours as needed. 18 g 3  . albuterol (PROVENTIL) (2.5 MG/3ML) 0.083% nebulizer solution Take 3 mLs (2.5 mg total) by nebulization every 6 (six) hours as needed for wheezing or shortness of breath. 75 mL 12  . amLODipine (NORVASC) 5 MG tablet Take 5 mg daily for 1 week, then increase to 10 mg 60 tablet 2  . QVAR 40 MCG/ACT inhaler Inhale 40 mcg into the lungs daily as needed.  0   No current facility-administered medications for this visit.    Allergies:   Lisinopril and Soap    Social History:  The patient  reports that he has never smoked. He has never used smokeless tobacco. He reports that he drinks alcohol. He reports that he does not use illicit drugs.   Family History:  The patient's family history includes Cancer in his mother; Hyperlipidemia in his father.    ROS:  Please see the history of present illness.   Otherwise, review of systems are positive for none.   All other systems are reviewed and negative.    PHYSICAL EXAM: VS:  BP 142/90 mmHg  Pulse 100  Ht 5\' 11"  (1.803 m)  Wt 93.169 kg (205 lb 6.4 oz)  BMI 28.66 kg/m2  SpO2 96% , BMI Body mass index is 28.66 kg/(m^2). Affect appropriate Healthy:  appears stated age HEENT: normal Neck supple with no adenopathy JVP normal no bruits no thyromegaly Lungs clear with no wheezing and good diaphragmatic motion Heart:  S1/S2 no murmur, no rub, gallop or click PMI normal Abdomen: benighn, BS positve, no tenderness, no AAA no bruit.  No HSM or HJR Distal pulses intact with no bruits No edema Neuro non-focal Skin warm and dry No muscular weakness    EKG:  02/21/15  SR nonspecific ST/T wave changes    Recent Labs: 09/03/2014: Platelets 250 02/19/2015:  Hemoglobin 15.9 02/21/2015: ALT 26; BUN 13; Creat 0.88; Potassium 4.0; Sodium 138    Lipid Panel    Component Value Date/Time   CHOL 202* 02/21/2015 0955   TRIG 82 02/21/2015 0955   HDL 54 02/21/2015 0955   CHOLHDL 3.7 02/21/2015 0955   VLDL 16 02/21/2015 0955   LDLCALC 132* 02/21/2015 0955      Wt Readings from Last 3 Encounters:  04/30/15 93.169 kg (205 lb 6.4 oz)  04/21/15 92.534 kg (204 lb)  04/12/15 91.173 kg (201 lb)      Other studies Reviewed: Additional studies/ records that were reviewed today include: Epic notes from primary .    ASSESSMENT AND PLAN:  1.  HTN:  Continue norvasc.  Not clear if we could try beta blocker or ARBls in future.  24 hr BP monitor  Continue low sodium diet 2. Asthma:  Seems related to allergins in [place]  No active wheezing  PRN inhaler and flonase/claritin 3. LDL  132 no vascular disease  Diet Rx    Current medicines are reviewed at length with the patient today.  The patient does not have concerns regarding medicines.  The following changes have been made:  no change  Labs/ tests ordered today include:  24 hr BP monitor   Orders Placed This Encounter  Procedures  . AMB BP MONITORING     Disposition:   FU with next available      Signed, Charlton Haws, MD  04/30/2015 5:04 PM    [facility] [address] [place], [place], [place]  [address] Phone: [phone]; Fax: [phone]

## 2015-04-30 ENCOUNTER — Encounter: Payer: Self-pay | Admitting: Cardiovascular Disease

## 2015-04-30 ENCOUNTER — Ambulatory Visit (INDEPENDENT_AMBULATORY_CARE_PROVIDER_SITE_OTHER): Payer: 59 | Admitting: Cardiovascular Disease

## 2015-04-30 VITALS — BP 142/90 | HR 100 | Ht 71.0 in | Wt 205.4 lb

## 2015-04-30 DIAGNOSIS — R030 Elevated blood-pressure reading, without diagnosis of hypertension: Secondary | ICD-10-CM

## 2015-04-30 DIAGNOSIS — IMO0001 Reserved for inherently not codable concepts without codable children: Secondary | ICD-10-CM

## 2015-04-30 NOTE — Patient Instructions (Addendum)
Medication Instructions:  NO CHANGES   Labwork: NONE  Testing/Procedures: 24 HOUR  B/P  MONITOR   SEE  ORDERS   Follow-Up:  Your physician recommends that you schedule a follow-up appointment in: NEXT  AVAILABLE WITH  DR Eden Emms  Any Other Special Instructions Will Be Listed Below (If Applicable).

## 2015-05-01 ENCOUNTER — Encounter: Payer: Self-pay | Admitting: *Deleted

## 2015-05-01 ENCOUNTER — Ambulatory Visit (INDEPENDENT_AMBULATORY_CARE_PROVIDER_SITE_OTHER): Payer: 59

## 2015-05-01 DIAGNOSIS — R030 Elevated blood-pressure reading, without diagnosis of hypertension: Secondary | ICD-10-CM

## 2015-05-01 DIAGNOSIS — I10 Essential (primary) hypertension: Secondary | ICD-10-CM | POA: Insufficient documentation

## 2015-05-01 DIAGNOSIS — IMO0001 Reserved for inherently not codable concepts without codable children: Secondary | ICD-10-CM | POA: Insufficient documentation

## 2015-05-01 NOTE — Progress Notes (Signed)
Patient ID: Greg Stokes, male   DOB: [birth year], 34 y.o.   MRN: [id number] 24 hour ambulatory blood pressure monitor applied to patient.

## 2015-05-07 ENCOUNTER — Telehealth: Payer: Self-pay | Admitting: *Deleted

## 2015-05-07 NOTE — Telephone Encounter (Signed)
PT  AWARE OF  B/P REPORT  PER  DR NISHAN Eden EmmsEAN  DAY /NIGHT   B/P IS SATISFACTORY .Zack Seal

## 2015-05-10 NOTE — Progress Notes (Signed)
Patient ID: Greg Stokes, male   DOB: [birth year], 34 y.o.   MRN: [id number]     Cardiology Office Note   Date:  05/14/2015   ID:  Greg Stokes, DOB [birth year], MRN [id number]  PCP:  Janace Hoard, MD  Cardiologist:   Charlton Haws, MD   No chief complaint on file.     History of Present Illness: Greg Stokes is a 34 y.o. male who presents for evaluation of HTN and elevated BP.  Seen by Dr Dallas Schimke 02/21/15 and started on ACE.  Took this for about a month but developed a sensation of swelling in the back of his throat and it was stopped.  Has been taking norvasc 10 mg since.  He has some reactive airway disease since being in [place].  Did not have in in [place] , [place] or [place]  He does security work an Cytogeneticist.  Denies steroid use, stimulants, drugs.  Does not smoke  Does not actively wheeze but does have a PRN inhaler he last used this am.  No history of CRF, or kidney disease  No connective tissue disease and no arthritis.  Currently feels well on norvasc and occasional BP readings at [facility] have been normal half the time and mildly elevated the other half   Does it some salt with ethinic food.  Denies excess ETOH   Ambulatory BP monitor reviewed and average BP 130/80 with normal circadian variation     Past Medical History  Diagnosis Date  . Asthma     prn inhaler  . Retained orthopedic hardware 02/2015    left hand    Past Surgical History  Procedure Laterality Date  . Open reduction internal fixation (orif) metacarpal Left 07/28/2014    Procedure: OPEN REDUCTION INTERNAL FIXATION (ORIF) LEFT SMALL METACARPAL FRACTURE;  Surgeon: Dairl Ponder, MD;  Location: [facility];  Service: Orthopedics;  Laterality: Left;  . Hardware removal Left 02/19/2015    Procedure: REMOVAL PLATE/SCREWS WITH CAPSULECTOMY;  Surgeon: Dairl Ponder, MD;  Location: [facility];  Service: Orthopedics;  Laterality: Left;  . Tenolysis Left  02/19/2015    Procedure: TENOLYSIS LEFT SMALL FINGER METACARPAL ;  Surgeon: Dairl Ponder, MD;  Location: [facility];  Service: Orthopedics;  Laterality: Left;     Current Outpatient Prescriptions  Medication Sig Dispense Refill  . albuterol (PROVENTIL HFA;VENTOLIN HFA) 108 (90 BASE) MCG/ACT inhaler Inhale 1-2 puffs into the lungs every 6 (six) hours as needed for wheezing or shortness of breath.    Marland Kitchen albuterol (PROVENTIL) (2.5 MG/3ML) 0.083% nebulizer solution Take 3 mLs (2.5 mg total) by nebulization every 6 (six) hours as needed for wheezing or shortness of breath. 75 mL 12  . amLODipine (NORVASC) 10 MG tablet Take 10 mg by mouth daily.    Marland Kitchen QVAR 40 MCG/ACT inhaler Inhale 40 mcg into the lungs daily as needed (for wheezing & sob).   0   No current facility-administered medications for this visit.    Allergies:   Lisinopril and Soap    Social History:  The patient  reports that he has never smoked. He has never used smokeless tobacco. He reports that he drinks alcohol. He reports that he does not use illicit drugs.   Family History:  The patient's family history includes Cancer in his mother; Hyperlipidemia in his father.    ROS:  Please see the history of present illness.   Otherwise, review of systems are positive for none.   All other systems  are reviewed and negative.    PHYSICAL EXAM: VS:  BP 116/60 mmHg  Pulse 84  Ht 5\' 11"  (1.803 m)  Wt 93.495 kg (206 lb 1.9 oz)  BMI 28.76 kg/m2 , BMI Body mass index is 28.76 kg/(m^2). Affect appropriate Healthy:  appears stated age HEENT: normal Neck supple with no adenopathy JVP normal no bruits no thyromegaly Lungs clear with no wheezing and good diaphragmatic motion Heart:  S1/S2 no murmur, no rub, gallop or click PMI normal Abdomen: benighn, BS positve, no tenderness, no AAA no bruit.  No HSM or HJR Distal pulses intact with no bruits No edema Neuro non-focal Skin warm and dry No muscular  weakness    EKG:  02/21/15  SR nonspecific ST/T wave changes    Recent Labs: 09/03/2014: Platelets 250 02/19/2015: Hemoglobin 15.9 02/21/2015: ALT 26; BUN 13; Creat 0.88; Potassium 4.0; Sodium 138    Lipid Panel    Component Value Date/Time   CHOL 202* 02/21/2015 0955   TRIG 82 02/21/2015 0955   HDL 54 02/21/2015 0955   CHOLHDL 3.7 02/21/2015 0955   VLDL 16 02/21/2015 0955   LDLCALC 132* 02/21/2015 0955      Wt Readings from Last 3 Encounters:  05/14/15 93.495 kg (206 lb 1.9 oz)  04/30/15 93.169 kg (205 lb 6.4 oz)  04/21/15 92.534 kg (204 lb)      Other studies Reviewed: Additional studies/ records that were reviewed today include: Epic notes from primary .    ASSESSMENT AND PLAN:  1.  HTN:  Continue norvasc.  Not clear if we could try beta blocker or ARBls in future.  24 hr BP monitor shows reasonable control   Continue low sodium diet 2. Asthma:  Seems related to allergins in [place]  No active wheezing  PRN inhaler and flonase/claritin 3. LDL  132 no vascular disease  Diet Rx    Current medicines are reviewed at length with the patient today.  The patient does not have concerns regarding medicines.  The following changes have been made:  no change  Labs/ tests ordered today include:  None   No orders of the defined types were placed in this encounter.     Disposition:   FU with me PRN    Signed, Charlton Haws, MD  05/14/2015 9:59 AM    [facility] [address] [place], [place], [place]  [address] Phone: [phone]; Fax: [phone]

## 2015-05-14 ENCOUNTER — Ambulatory Visit (INDEPENDENT_AMBULATORY_CARE_PROVIDER_SITE_OTHER): Payer: 59 | Admitting: Cardiovascular Disease

## 2015-05-14 ENCOUNTER — Encounter: Payer: Self-pay | Admitting: Cardiovascular Disease

## 2015-05-14 VITALS — BP 116/60 | HR 84 | Ht 71.0 in | Wt 206.1 lb

## 2015-05-14 DIAGNOSIS — IMO0001 Reserved for inherently not codable concepts without codable children: Secondary | ICD-10-CM

## 2015-05-14 DIAGNOSIS — R030 Elevated blood-pressure reading, without diagnosis of hypertension: Secondary | ICD-10-CM | POA: Diagnosis not present

## 2015-05-14 NOTE — Patient Instructions (Signed)
Your physician recommends that you schedule a follow-up appointment in: AS NEEDED  Your physician recommends that you continue on your current medications as directed. Please refer to the Current Medication list given to you today.  

## 2015-05-18 ENCOUNTER — Telehealth: Payer: Self-pay | Admitting: Cardiovascular Disease

## 2015-05-18 NOTE — Telephone Encounter (Signed)
New message       Pt want a refill on prednisone sent in to [facility] at [address]

## 2015-05-18 NOTE — Telephone Encounter (Signed)
Calling wanting [place] to send a Rx into [facility] for Prednisone.  Advised that Prednisone is not in his current list of medications and could not Rx unless Dr. Eden Emms approved.  Dr. Eden Emms not in office today so advised that he would need to call his PCP.  He verbalizes understanding and will call his PCP.

## 2015-05-30 ENCOUNTER — Ambulatory Visit (INDEPENDENT_AMBULATORY_CARE_PROVIDER_SITE_OTHER): Payer: 59 | Admitting: Emergency Medicine

## 2015-05-30 VITALS — BP 128/80 | HR 85 | Temp 98.5°F | Resp 16 | Ht 71.0 in | Wt 209.8 lb

## 2015-05-30 DIAGNOSIS — Z202 Contact with and (suspected) exposure to infections with a predominantly sexual mode of transmission: Secondary | ICD-10-CM | POA: Diagnosis not present

## 2015-05-30 MED ORDER — DOXYCYCLINE HYCLATE 100 MG PO CAPS: 100.0000 mg | ORAL_CAPSULE | Freq: Two times a day (BID) | ORAL | Status: DC

## 2015-05-30 NOTE — Progress Notes (Signed)
Subjective:  Patient ID: Greg Stokes, male    DOB: August 03, 1981  Age: 34 y.o. MRN: [id number]  CC: Exposure to STD   HPI Greg Stokes presents  for evaluation treatment following notification by his girlfriend that she was treated today for chlamydia. Greg Stokes has no urethral discharge rash or other complaints. Greg Stokes has no history of prior STD treatment.  History Greg Stokes has a past medical history of Asthma and Retained orthopedic hardware (02/2015).   Greg Stokes has past surgical history that includes Open reduction internal fixation (orif) metacarpal (Left, 07/28/2014); Hardware Removal (Left, 02/19/2015); and Tenolysis (Left, 02/19/2015).   His  family history includes Cancer in his mother; Hyperlipidemia in his father.  Greg Stokes   reports that Greg Stokes has never smoked. Greg Stokes has never used smokeless tobacco. Greg Stokes reports that Greg Stokes drinks alcohol. Greg Stokes reports that Greg Stokes does not use illicit drugs.  Outpatient Prescriptions Prior to Visit  Medication Sig Dispense Refill  . albuterol (PROVENTIL HFA;VENTOLIN HFA) 108 (90 BASE) MCG/ACT inhaler Inhale 1-2 puffs into the lungs every 6 (six) hours as needed for wheezing or shortness of breath.    Marland Kitchen albuterol (PROVENTIL) (2.5 MG/3ML) 0.083% nebulizer solution Take 3 mLs (2.5 mg total) by nebulization every 6 (six) hours as needed for wheezing or shortness of breath. 75 mL 12  . amLODipine (NORVASC) 10 MG tablet Take 10 mg by mouth daily.    Marland Kitchen QVAR 40 MCG/ACT inhaler Inhale 40 mcg into the lungs daily as needed (for wheezing & sob).   0   No facility-administered medications prior to visit.    Social History   Social History  . Marital Status: Single    Spouse Name: N/A  . Number of Children: N/A  . Years of Education: N/A   Social History Main Topics  . Smoking status: Never Smoker   . Smokeless tobacco: Never Used  . Alcohol Use: Yes     Comment: 1 x/week  . Drug Use: No  . Sexual Activity: Yes   Other Topics Concern  . None   Social History  Narrative     Review of Systems  Constitutional: Negative for fever, chills and appetite change.  HENT: Negative for congestion, ear pain, postnasal drip, sinus pressure and sore throat.   Eyes: Negative for pain and redness.  Respiratory: Negative for cough, shortness of breath and wheezing.   Cardiovascular: Negative for leg swelling.  Gastrointestinal: Negative for nausea, vomiting, abdominal pain, diarrhea, constipation and blood in stool.  Endocrine: Negative for polyuria.  Genitourinary: Negative for dysuria, urgency, frequency and flank pain.  Musculoskeletal: Negative for gait problem.  Skin: Negative for rash.  Neurological: Negative for weakness and headaches.  Psychiatric/Behavioral: Negative for confusion and decreased concentration. The patient is not nervous/anxious.     Objective:  BP 128/80 mmHg  Pulse 85  Temp(Src) 98.5 F (36.9 C) (Oral)  Resp 16  Ht 5\' 11"  (1.803 m)  Wt 209 lb 12.8 oz (95.165 kg)  BMI 29.27 kg/m2  SpO2 99%  Physical Exam  Constitutional: Greg Stokes is oriented to person, place, and time. Greg Stokes appears well-developed and well-nourished.  HENT:  Head: Normocephalic and atraumatic.  Eyes: Conjunctivae are normal. Pupils are equal, round, and reactive to light.  Pulmonary/Chest: Effort normal.  Genitourinary: Testes normal and penis normal.  Musculoskeletal: Greg Stokes exhibits no edema.  Neurological: Greg Stokes is alert and oriented to person, place, and time.  Skin: Skin is dry.  Psychiatric: Greg Stokes has a normal mood and affect. His behavior is normal. Thought content  normal.      Assessment & Plan:   Greg Stokes was seen today for exposure to std.  Diagnoses and all orders for this visit:  Exposure to STD -     HIV antibody -     RPR -     GC/Chlamydia Probe Amp  Other orders -     doxycycline (VIBRAMYCIN) 100 MG capsule; Take 1 capsule (100 mg total) by mouth 2 (two) times daily.   I am having Greg Stokes start on doxycycline. I am also having him  maintain his albuterol, QVAR, albuterol, and amLODipine.  Meds ordered this encounter  Medications  . doxycycline (VIBRAMYCIN) 100 MG capsule    Sig: Take 1 capsule (100 mg total) by mouth 2 (two) times daily.    Dispense:  20 capsule    Refill:  0    Appropriate red flag conditions were discussed with the patient as well as actions that should be taken.  Patient expressed his understanding.  Follow-up: Return if symptoms worsen or fail to improve.  Carmelina Dane, MD

## 2015-05-30 NOTE — Patient Instructions (Signed)

## 2015-05-31 ENCOUNTER — Encounter: Payer: Self-pay | Admitting: Family Medicine

## 2015-05-31 LAB — GC/CHLAMYDIA PROBE AMP
CT PROBE, AMP APTIMA: NEGATIVE
GC PROBE AMP APTIMA: NEGATIVE

## 2015-05-31 LAB — RPR

## 2015-05-31 LAB — HIV ANTIBODY (ROUTINE TESTING W REFLEX): HIV 1&2 Ab, 4th Generation: NONREACTIVE

## 2015-06-06 ENCOUNTER — Other Ambulatory Visit: Payer: Self-pay | Admitting: Physician Assistant

## 2015-06-26 ENCOUNTER — Telehealth: Payer: Self-pay

## 2015-06-26 DIAGNOSIS — J302 Other seasonal allergic rhinitis: Secondary | ICD-10-CM

## 2015-06-26 DIAGNOSIS — J4541 Moderate persistent asthma with (acute) exacerbation: Secondary | ICD-10-CM

## 2015-06-26 NOTE — Telephone Encounter (Signed)
Patient is requesting a referral to an asthma and allergy specialist.  [phone]

## 2015-06-27 NOTE — Telephone Encounter (Signed)
Referral to pulmonology for asthma per Dr. Alwyn RenHopper

## 2015-06-27 NOTE — Telephone Encounter (Signed)
Called and spoke with pt and he stated that he would like to be referred to an asthma and allergy specialist.  Pt stated that Dr. Alwyn RenHopper is his PCP.  Will forward message to Dr. Alwyn RenHopper to make him aware.  Dr. Alwyn RenHopper please advise if referral may be placed. thanks

## 2015-06-27 NOTE — Telephone Encounter (Signed)
Please refer to pulmonologist per patient request for asthma

## 2015-06-28 NOTE — Telephone Encounter (Signed)
Referral made for pumonology will advised patient

## 2015-06-28 NOTE — Addendum Note (Signed)
Addended byCaffie Damme: LITTRELL, AMY W on: 06/28/2015 02:43 PM   Modules accepted: Orders

## 2015-06-28 NOTE — Telephone Encounter (Signed)
I have called patient to advise referral made to pulmonology, he states he wants allergy testing only, does not want to see Pulmonology

## 2015-06-29 NOTE — Telephone Encounter (Signed)
Done

## 2015-06-29 NOTE — Addendum Note (Signed)
Addended by: Morrell RiddleWEBER, Kylen Ismael L on: 06/29/2015 10:48 AM   Modules accepted: Orders

## 2015-09-14 ENCOUNTER — Ambulatory Visit (INDEPENDENT_AMBULATORY_CARE_PROVIDER_SITE_OTHER): Payer: 59

## 2015-09-14 ENCOUNTER — Ambulatory Visit (INDEPENDENT_AMBULATORY_CARE_PROVIDER_SITE_OTHER): Payer: 59 | Admitting: Family Medicine

## 2015-09-14 VITALS — BP 162/90 | HR 97 | Temp 98.1°F | Resp 16 | Ht 71.0 in | Wt 209.0 lb

## 2015-09-14 DIAGNOSIS — I10 Essential (primary) hypertension: Secondary | ICD-10-CM

## 2015-09-14 DIAGNOSIS — M542 Cervicalgia: Secondary | ICD-10-CM

## 2015-09-14 DIAGNOSIS — S161XXA Strain of muscle, fascia and tendon at neck level, initial encounter: Secondary | ICD-10-CM | POA: Diagnosis not present

## 2015-09-14 MED ORDER — METHOCARBAMOL 500 MG PO TABS
500.0000 mg | ORAL_TABLET | Freq: Three times a day (TID) | ORAL | Status: DC | PRN
Start: 2015-09-14 — End: 2017-11-10

## 2015-09-14 MED ORDER — PREDNISONE 20 MG PO TABS: ORAL_TABLET | ORAL | Status: DC
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
## 2015-09-26 ENCOUNTER — Encounter: Payer: Self-pay | Admitting: Physician Assistant

## 2015-09-26 DIAGNOSIS — J4540 Moderate persistent asthma, uncomplicated: Secondary | ICD-10-CM | POA: Insufficient documentation

## 2015-10-08 ENCOUNTER — Encounter: Payer: Self-pay | Admitting: Family Medicine

## 2015-10-09 MED ORDER — AMOXICILLIN-POT CLAVULANATE 875-125 MG PO TABS: 1.0000 | ORAL_TABLET | Freq: Two times a day (BID) | ORAL | Status: DC

## 2015-10-09 NOTE — Addendum Note (Signed)
Addended by: Abbe Amsterdam C on: 10/09/2015 04:35 PM   Modules accepted: Orders, Medications

## 2015-10-15 ENCOUNTER — Other Ambulatory Visit: Payer: Self-pay | Admitting: Physician Assistant

## 2015-11-05 ENCOUNTER — Encounter: Payer: Self-pay | Admitting: Family Medicine

## 2015-11-05 ENCOUNTER — Ambulatory Visit (INDEPENDENT_AMBULATORY_CARE_PROVIDER_SITE_OTHER): Payer: BLUE CROSS/BLUE SHIELD

## 2015-11-05 ENCOUNTER — Ambulatory Visit (INDEPENDENT_AMBULATORY_CARE_PROVIDER_SITE_OTHER): Payer: BLUE CROSS/BLUE SHIELD | Admitting: Family Medicine

## 2015-11-05 VITALS — BP 142/90 | HR 76 | Temp 98.1°F | Resp 16 | Ht 71.5 in | Wt 224.0 lb

## 2015-11-05 DIAGNOSIS — I10 Essential (primary) hypertension: Secondary | ICD-10-CM | POA: Diagnosis not present

## 2015-11-05 DIAGNOSIS — M25532 Pain in left wrist: Secondary | ICD-10-CM | POA: Diagnosis not present

## 2015-11-05 DIAGNOSIS — S63502A Unspecified sprain of left wrist, initial encounter: Secondary | ICD-10-CM

## 2015-11-05 NOTE — Progress Notes (Signed)
Subjective:    Patient ID: Greg Stokes, male    DOB: 1980/12/14, 35 y.o.   MRN: [id number]  HPI  Greg Stokes is a 35 y.o. male  Here complaining of left wrist pain. 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 Fell at work approximately 10 days ago when he slipped at work, but no initial swelling or pain in the wrist. Did notice some soreness proximal 3 days later with some popping and clicking, notices some discomfort with movement of the wrist, but specifically with clenching or gripping objects. No previous wrist fracture or surgery, but does have a history of a boxer's fracture that was repaired years ago. Intermittent wrist pain in the past with previous boxing, but no fractures, and had been doing okay prior to this injury. Attempted treatment included ice only, no medications taken. Still persistent pain, he is concerned for a TFCC injury.  History of hypertension. Blood pressure in office 157/111.  Did drink a red bull prior to coming into the office. Denies any recent missed doses of medications. Denies chest pain, weakness, headache, dizziness. Denies cocaine or other illicit drug use.  Patient Active Problem List   Diagnosis Date Noted  . Moderate persistent asthma 09/26/2015  . Elevated BP 05/01/2015  . Rhinitis, allergic 04/13/2015   Past Medical History  Diagnosis Date  . Asthma     prn inhaler  . Retained orthopedic hardware 02/2015    left hand  . Hypertension    Past Surgical History  Procedure Laterality Date  . Open reduction internal fixation (orif) metacarpal Left 07/28/2014    Procedure: OPEN REDUCTION INTERNAL FIXATION (ORIF) LEFT SMALL METACARPAL FRACTURE;  Surgeon: Dairl Ponder, MD;  Location: [facility];  Service: Orthopedics;  Laterality: Left;  . Hardware removal Left 02/19/2015    Procedure: REMOVAL PLATE/SCREWS WITH CAPSULECTOMY;  Surgeon: Dairl Ponder, MD;  Location: [facility];   Service: Orthopedics;  Laterality: Left;  . Tenolysis Left 02/19/2015    Procedure: TENOLYSIS LEFT SMALL FINGER METACARPAL ;  Surgeon: Dairl Ponder, MD;  Location: [facility];  Service: Orthopedics;  Laterality: Left;   Allergies  Allergen Reactions  . Lisinopril Swelling  . Soap Rash    SCENTED SOAPS   Prior to Admission medications   Medication Sig Start Date End Date Taking? Authorizing Provider  amLODipine (NORVASC) 10 MG tablet Take 10 mg by mouth daily. Reported on 09/14/2015   Yes Historical Provider, MD  methocarbamol (ROBAXIN) 500 MG tablet Take 1 tablet (500 mg total) by mouth every 8 (eight) hours as needed for muscle spasms. 09/14/15  Yes Ethelda Chick, MD  VENTOLIN HFA 108 [phone] Base) MCG/ACT inhaler INHALE ONE TO TWO PUFFS BY MOUTH EVERY 4 HOURS AS NEEDED 10/15/15  Yes Chelle Jeffery, PA-C  QVAR 40 MCG/ACT inhaler INHALE 1 PUFF INTO THE LUNGS BY MOUTH TWICE DAILY Patient not taking: Reported on 11/05/2015 06/07/15   Overton Mam   Social History   Social History  . Marital Status: Single    Spouse Name: N/A  . Number of Children: N/A  . Years of Education: N/A   Occupational History  . Not on file.   Social History Main Topics  . Smoking status: Never Smoker   . Smokeless tobacco: Never Used  . Alcohol Use: Yes     Comment: 1 x/week  . Drug Use: No  . Sexual Activity: Yes   Other Topics Concern  . Not  on file   Social History Narrative      Review of Systems  Constitutional: Negative for fatigue and unexpected weight change.  Eyes: Negative for visual disturbance.  Respiratory: Negative for cough, chest tightness and shortness of breath.   Cardiovascular: Negative for chest pain, palpitations and leg swelling.  Gastrointestinal: Negative for abdominal pain and blood in stool.  Musculoskeletal: Positive for arthralgias.  Neurological: Negative for dizziness, light-headedness and headaches.       Objective:   Physical Exam    Constitutional: He is oriented to person, place, and time. He appears well-developed and well-nourished.  HENT:  Head: Normocephalic and atraumatic.  Eyes: EOM are normal. Pupils are equal, round, and reactive to light.  Neck: No JVD present. Carotid bruit is not present.  Cardiovascular: Normal rate, regular rhythm and normal heart sounds.   No murmur heard. Pulmonary/Chest: Effort normal and breath sounds normal. He has no rales.  Musculoskeletal: He exhibits no edema.       Left wrist: He exhibits decreased range of motion, tenderness and bony tenderness. He exhibits no swelling, no effusion, no crepitus and no deformity.       Arms: Neurological: He is alert and oriented to person, place, and time.  Skin: Skin is warm and dry.  Psychiatric: He has a normal mood and affect.  Vitals reviewed.  Filed Vitals:   11/05/15 1401  BP: 157/111  Pulse: 76  Temp: 98.1 F (36.7 C)  Resp: 16  Height: 5' 11.5" (1.816 m)  Weight: 224 lb (101.606 kg)        Assessment & Plan:  Greg Stokes is a 35 y.o. male Left wrist pain - Plan: DG Wrist Complete Left,Sprain of left wrist, initial encounter -No apparent fracture on initial x-ray, but x-ray reading pending.  -Over-the-counter wrist brace as needed during work, range of motion if not painful, Tylenol as needed given underlying high blood pressure and elevations in office initially  -Trial of home exercise program on handout for wrist sprain.  -. If symptoms not improved in next 1 week, consider hand surgeon evaluation or MR arthrogram to rule out to tfcc/ ulnar-sided wrist injury.  Essential hypertension  -Lower on recheck, but still slightly elevated. Avoid energy drinks, check blood pressure outside the office, and if remaining elevated, RTC to discuss medication regimen. Sooner if worse.    No orders of the defined types were placed in this encounter.   Patient Instructions  Avoid energy drinks with your underlying high  blood pressure, as that is likely a contributor to elevated reading today. Check blood pressure outside of the office over the next week, and if remaining over 140/90, return here to discuss change in medications.  Return to the clinic or go to the nearest emergency room if any of your symptoms worsen or new symptoms occur.  On your wrist, I do not see any fractures. the radiologist will also look at this. Can try over-the-counter wrist brace while at work, range of motion outside of work, Tylenol for now as blood pressure elevated. If pain is not improving within this next week, would recommend follow-up with hand surgeon to determine if you do have a TFCC injury or MRI is needed. Sooner if worse.    Wrist Sprain With Rehab A sprain is an injury in which a ligament that maintains the proper alignment of a joint is partially or completely torn. The ligaments of the wrist are susceptible to sprains. Sprains are classified into three categories. Grade  1 sprains cause pain, but the tendon is not lengthened. Grade 2 sprains include a lengthened ligament because the ligament is stretched or partially ruptured. With grade 2 sprains there is still function, although the function may be diminished. Grade 3 sprains are characterized by a complete tear of the tendon or muscle, and function is usually impaired. SYMPTOMS   Pain tenderness, inflammation, and/or bruising (contusion) of the injury.  A "pop" or tear felt and/or heard at the time of injury.  Decreased wrist function. CAUSES  A wrist sprain occurs when a force is placed on one or more ligaments that is greater than it/they can withstand. Common mechanisms of injury include:  Catching a ball with your hands.  Repetitive and/ or strenuous extension or flexion of the wrist. RISK INCREASES WITH:  Previous wrist injury.  Contact sports (boxing or wrestling).  Activities in which falling is common.  Poor strength and  flexibility.  Improperly fitted or padded protective equipment. PREVENTION  Warm up and stretch properly before activity.  Allow for adequate recovery between workouts.  Maintain physical fitness:  Strength, flexibility, and endurance.  Cardiovascular fitness.  Protect the wrist joint by limiting its motion with the use of taping, braces, or splints.  Protect the wrist after injury for 6 to 12 months. PROGNOSIS  The prognosis for wrist sprains depends on the degree of injury. Grade 1 sprains require 2 to 6 weeks of treatment. Grade 2 sprains require 6 to 8 weeks of treatment, and grade 3 sprains require up to 12 weeks.  RELATED COMPLICATIONS   Prolonged healing time, if improperly treated or re-injured.  Recurrent symptoms that result in a chronic problem.  Injury to nearby structures (bone, cartilage, nerves, or tendons).  Arthritis of the wrist.  Inability to compete in athletics at a high level.  Wrist stiffness or weakness.  Progression to a complete rupture of the ligament. TREATMENT  Treatment initially involves resting from any activities that aggravate the symptoms, and the use of ice and medications to help reduce pain and inflammation. Your caregiver may recommend immobilizing the wrist for a period of time in order to reduce stress on the ligament and allow for healing. After immobilization it is important to perform strengthening and stretching exercises to help regain strength and a full range of motion. These exercises may be completed at home or with a therapist. Surgery is not usually required for wrist sprains, unless the ligament has been ruptured (grade 3 sprain). MEDICATION   If pain medication is necessary, then nonsteroidal anti-inflammatory medications, such as aspirin and ibuprofen, or other minor pain relievers, such as acetaminophen, are often recommended.  Do not take pain medication for 7 days before surgery.  Prescription pain relievers may be  given if deemed necessary by your caregiver. Use only as directed and only as much as you need. HEAT AND COLD  Cold treatment (icing) relieves pain and reduces inflammation. Cold treatment should be applied for 10 to 15 minutes every 2 to 3 hours for inflammation and pain and immediately after any activity that aggravates your symptoms. Use ice packs or massage the area with a piece of ice (ice massage).  Heat treatment may be used prior to performing the stretching and strengthening activities prescribed by your caregiver, physical therapist, or athletic trainer. Use a heat pack or soak your injury in warm water. SEEK MEDICAL CARE IF:  Treatment seems to offer no benefit, or the condition worsens.  Any medications produce adverse side effects. EXERCISES RANGE  OF MOTION (ROM) AND STRETCHING EXERCISES - Wrist Sprain  These exercises may help you when beginning to rehabilitate your injury. Your symptoms may resolve with or without further involvement from your physician, physical therapist or athletic trainer. While completing these exercises, remember:   Restoring tissue flexibility helps normal motion to return to the joints. This allows healthier, less painful movement and activity.  An effective stretch should be held for at least 30 seconds.  A stretch should never be painful. You should only feel a gentle lengthening or release in the stretched tissue. RANGE OF MOTION - Wrist Flexion, Active-Assisted  Extend your right / left elbow with your fingers pointing down.*  Gently pull the back of your hand towards you until you feel a gentle stretch on the top of your forearm.  Hold this position for __________ seconds. Repeat __________ times. Complete this exercise __________ times per day.  *If directed by your physician, physical therapist or athletic trainer, complete this stretch with your elbow bent rather than extended. RANGE OF MOTION - Wrist Extension, Active-Assisted  Extend  your right / left elbow and turn your palm upwards.*  Gently pull your palm/fingertips back so your wrist extends and your fingers point more toward the ground.  You should feel a gentle stretch on the inside of your forearm.  Hold this position for __________ seconds. Repeat __________ times. Complete this exercise __________ times per day. *If directed by your physician, physical therapist or athletic trainer, complete this stretch with your elbow bent, rather than extended. RANGE OF MOTION - Supination, Active  Stand or sit with your elbows at your side. Bend your right / left elbow to 90 degrees.  Turn your palm upward until you feel a gentle stretch on the inside of your forearm.  Hold this position for __________ seconds. Slowly release and return to the starting position. Repeat __________ times. Complete this stretch __________ times per day.  RANGE OF MOTION - Pronation, Active  Stand or sit with your elbows at your side. Bend your right / left elbow to 90 degrees.  Turn your palm downward until you feel a gentle stretch on the top of your forearm.  Hold this position for __________ seconds. Slowly release and return to the starting position. Repeat __________ times. Complete this stretch __________ times per day.  STRETCH - Wrist Flexion  Place the back of your right / left hand on a tabletop leaving your elbow slightly bent. Your fingers should point away from your body.  Gently press the back of your hand down onto the table by straightening your elbow. You should feel a stretch on the top of your forearm.  Hold this position for __________ seconds. Repeat __________ times. Complete this stretch __________ times per day.  STRETCH - Wrist Extension  Place your right / left fingertips on a tabletop leaving your elbow slightly bent. Your fingers should point backwards.  Gently press your fingers and palm down onto the table by straightening your elbow. You should feel a  stretch on the inside of your forearm.  Hold this position for __________ seconds. Repeat __________ times. Complete this stretch __________ times per day.  STRENGTHENING EXERCISES - Wrist Sprain These exercises may help you when beginning to rehabilitate your injury. They may resolve your symptoms with or without further involvement from your physician, physical therapist or athletic trainer. While completing these exercises, remember:   Muscles can gain both the endurance and the strength needed for everyday activities through controlled exercises.  Complete these exercises as instructed by your physician, physical therapist or athletic trainer. Progress with the resistance and repetition exercises only as your caregiver advises. STRENGTH - Wrist Flexors  Sit with your right / left forearm palm-up and fully supported. Your elbow should be resting below the height of your shoulder. Allow your wrist to extend over the edge of the surface.  Loosely holding a __________ weight or a piece of rubber exercise band/tubing, slowly curl your hand up toward your forearm.  Hold this position for __________ seconds. Slowly lower the wrist back to the starting position in a controlled manner. Repeat __________ times. Complete this exercise __________ times per day.  STRENGTH - Wrist Extensors  Sit with your right / left forearm palm-down and fully supported. Your elbow should be resting below the height of your shoulder. Allow your wrist to extend over the edge of the surface.  Loosely holding a __________ weight or a piece of rubber exercise band/tubing, slowly curl your hand up toward your forearm.  Hold this position for __________ seconds. Slowly lower the wrist back to the starting position in a controlled manner. Repeat __________ times. Complete this exercise __________ times per day.  STRENGTH - Ulnar Deviators  Stand with a ____________________ weight in your right / left hand, or sit  holding on to the rubber exercise band/tubing with your opposite arm supported.  Move your wrist so that your pinkie travels toward your forearm and your thumb moves away from your forearm.  Hold this position for __________ seconds and then slowly lower the wrist back to the starting position. Repeat __________ times. Complete this exercise __________ times per day STRENGTH - Radial Deviators  Stand with a ____________________ weight in your  right / left hand, or sit holding on to the rubber exercise band/tubing with your arm supported.  Raise your hand upward in front of you or pull up on the rubber tubing.  Hold this position for __________ seconds and then slowly lower the wrist back to the starting position. Repeat __________ times. Complete this exercise __________ times per day. STRENGTH - Forearm Supinators  Sit with your right / left forearm supported on a table, keeping your elbow below shoulder height. Rest your hand over the edge, palm down.  Gently grip a hammer or a soup ladle.  Without moving your elbow, slowly turn your palm and hand upward to a "thumbs-up" position.  Hold this position for __________ seconds. Slowly return to the starting position. Repeat __________ times. Complete this exercise __________ times per day.  STRENGTH - Forearm Pronators  Sit with your right / left forearm supported on a table, keeping your elbow below shoulder height. Rest your hand over the edge, palm up.  Gently grip a hammer or a soup ladle.  Without moving your elbow, slowly turn your palm and hand upward to a "thumbs-up" position.  Hold this position for __________ seconds. Slowly return to the starting position. Repeat __________ times. Complete this exercise __________ times per day.  STRENGTH - Grip  Grasp a tennis ball, a dense sponge, or a large, rolled sock in your hand.  Squeeze as hard as you can without increasing any pain.  Hold this position for __________  seconds. Release your grip slowly. Repeat __________ times. Complete this exercise __________ times per day.    This information is not intended to replace advice given to you by your health care provider. Make sure you discuss any questions you have with your health care provider.  Document Released: 09/01/2005 Document Revised: 05/23/2015 Document Reviewed: 12/14/2008 [employer] Interactive Patient Education [employer]

## 2015-11-05 NOTE — Patient Instructions (Signed)
Avoid energy drinks with your underlying high blood pressure, as that is likely a contributor to elevated reading today. Check blood pressure outside of the office over the next week, and if remaining over 140/90, return here to discuss change in medications.  Return to the clinic or go to the nearest emergency room if any of your symptoms worsen or new symptoms occur.  On your wrist, I do not see any fractures. the radiologist will also look at this. Can try over-the-counter wrist brace while at work, range of motion outside of work, Tylenol for now as blood pressure elevated. If pain is not improving within this next week, would recommend follow-up with hand surgeon to determine if you do have a TFCC injury or MRI is needed. Sooner if worse.    Wrist Sprain With Rehab A sprain is an injury in which a ligament that maintains the proper alignment of a joint is partially or completely torn. The ligaments of the wrist are susceptible to sprains. Sprains are classified into three categories. Grade 1 sprains cause pain, but the tendon is not lengthened. Grade 2 sprains include a lengthened ligament because the ligament is stretched or partially ruptured. With grade 2 sprains there is still function, although the function may be diminished. Grade 3 sprains are characterized by a complete tear of the tendon or muscle, and function is usually impaired. SYMPTOMS   Pain tenderness, inflammation, and/or bruising (contusion) of the injury.  A "pop" or tear felt and/or heard at the time of injury.  Decreased wrist function. CAUSES  A wrist sprain occurs when a force is placed on one or more ligaments that is greater than it/they can withstand. Common mechanisms of injury include:  Catching a ball with your hands.  Repetitive and/ or strenuous extension or flexion of the wrist. RISK INCREASES WITH:  Previous wrist injury.  Contact sports (boxing or wrestling).  Activities in which falling is  common.  Poor strength and flexibility.  Improperly fitted or padded protective equipment. PREVENTION  Warm up and stretch properly before activity.  Allow for adequate recovery between workouts.  Maintain physical fitness:  Strength, flexibility, and endurance.  Cardiovascular fitness.  Protect the wrist joint by limiting its motion with the use of taping, braces, or splints.  Protect the wrist after injury for 6 to 12 months. PROGNOSIS  The prognosis for wrist sprains depends on the degree of injury. Grade 1 sprains require 2 to 6 weeks of treatment. Grade 2 sprains require 6 to 8 weeks of treatment, and grade 3 sprains require up to 12 weeks.  RELATED COMPLICATIONS   Prolonged healing time, if improperly treated or re-injured.  Recurrent symptoms that result in a chronic problem.  Injury to nearby structures (bone, cartilage, nerves, or tendons).  Arthritis of the wrist.  Inability to compete in athletics at a high level.  Wrist stiffness or weakness.  Progression to a complete rupture of the ligament. TREATMENT  Treatment initially involves resting from any activities that aggravate the symptoms, and the use of ice and medications to help reduce pain and inflammation. Your caregiver may recommend immobilizing the wrist for a period of time in order to reduce stress on the ligament and allow for healing. After immobilization it is important to perform strengthening and stretching exercises to help regain strength and a full range of motion. These exercises may be completed at home or with a therapist. Surgery is not usually required for wrist sprains, unless the ligament has been ruptured (grade  3 sprain). MEDICATION   If pain medication is necessary, then nonsteroidal anti-inflammatory medications, such as aspirin and ibuprofen, or other minor pain relievers, such as acetaminophen, are often recommended.  Do not take pain medication for 7 days before  surgery.  Prescription pain relievers may be given if deemed necessary by your caregiver. Use only as directed and only as much as you need. HEAT AND COLD  Cold treatment (icing) relieves pain and reduces inflammation. Cold treatment should be applied for 10 to 15 minutes every 2 to 3 hours for inflammation and pain and immediately after any activity that aggravates your symptoms. Use ice packs or massage the area with a piece of ice (ice massage).  Heat treatment may be used prior to performing the stretching and strengthening activities prescribed by your caregiver, physical therapist, or athletic trainer. Use a heat pack or soak your injury in warm water. SEEK MEDICAL CARE IF:  Treatment seems to offer no benefit, or the condition worsens.  Any medications produce adverse side effects. EXERCISES RANGE OF MOTION (ROM) AND STRETCHING EXERCISES - Wrist Sprain  These exercises may help you when beginning to rehabilitate your injury. Your symptoms may resolve with or without further involvement from your physician, physical therapist or athletic trainer. While completing these exercises, remember:   Restoring tissue flexibility helps normal motion to return to the joints. This allows healthier, less painful movement and activity.  An effective stretch should be held for at least 30 seconds.  A stretch should never be painful. You should only feel a gentle lengthening or release in the stretched tissue. RANGE OF MOTION - Wrist Flexion, Active-Assisted  Extend your right / left elbow with your fingers pointing down.*  Gently pull the back of your hand towards you until you feel a gentle stretch on the top of your forearm.  Hold this position for __________ seconds. Repeat __________ times. Complete this exercise __________ times per day.  *If directed by your physician, physical therapist or athletic trainer, complete this stretch with your elbow bent rather than extended. RANGE OF MOTION  - Wrist Extension, Active-Assisted  Extend your right / left elbow and turn your palm upwards.*  Gently pull your palm/fingertips back so your wrist extends and your fingers point more toward the ground.  You should feel a gentle stretch on the inside of your forearm.  Hold this position for __________ seconds. Repeat __________ times. Complete this exercise __________ times per day. *If directed by your physician, physical therapist or athletic trainer, complete this stretch with your elbow bent, rather than extended. RANGE OF MOTION - Supination, Active  Stand or sit with your elbows at your side. Bend your right / left elbow to 90 degrees.  Turn your palm upward until you feel a gentle stretch on the inside of your forearm.  Hold this position for __________ seconds. Slowly release and return to the starting position. Repeat __________ times. Complete this stretch __________ times per day.  RANGE OF MOTION - Pronation, Active  Stand or sit with your elbows at your side. Bend your right / left elbow to 90 degrees.  Turn your palm downward until you feel a gentle stretch on the top of your forearm.  Hold this position for __________ seconds. Slowly release and return to the starting position. Repeat __________ times. Complete this stretch __________ times per day.  STRETCH - Wrist Flexion  Place the back of your right / left hand on a tabletop leaving your elbow slightly bent. Your  fingers should point away from your body.  Gently press the back of your hand down onto the table by straightening your elbow. You should feel a stretch on the top of your forearm.  Hold this position for __________ seconds. Repeat __________ times. Complete this stretch __________ times per day.  STRETCH - Wrist Extension  Place your right / left fingertips on a tabletop leaving your elbow slightly bent. Your fingers should point backwards.  Gently press your fingers and palm down onto the table by  straightening your elbow. You should feel a stretch on the inside of your forearm.  Hold this position for __________ seconds. Repeat __________ times. Complete this stretch __________ times per day.  STRENGTHENING EXERCISES - Wrist Sprain These exercises may help you when beginning to rehabilitate your injury. They may resolve your symptoms with or without further involvement from your physician, physical therapist or athletic trainer. While completing these exercises, remember:   Muscles can gain both the endurance and the strength needed for everyday activities through controlled exercises.  Complete these exercises as instructed by your physician, physical therapist or athletic trainer. Progress with the resistance and repetition exercises only as your caregiver advises. STRENGTH - Wrist Flexors  Sit with your right / left forearm palm-up and fully supported. Your elbow should be resting below the height of your shoulder. Allow your wrist to extend over the edge of the surface.  Loosely holding a __________ weight or a piece of rubber exercise band/tubing, slowly curl your hand up toward your forearm.  Hold this position for __________ seconds. Slowly lower the wrist back to the starting position in a controlled manner. Repeat __________ times. Complete this exercise __________ times per day.  STRENGTH - Wrist Extensors  Sit with your right / left forearm palm-down and fully supported. Your elbow should be resting below the height of your shoulder. Allow your wrist to extend over the edge of the surface.  Loosely holding a __________ weight or a piece of rubber exercise band/tubing, slowly curl your hand up toward your forearm.  Hold this position for __________ seconds. Slowly lower the wrist back to the starting position in a controlled manner. Repeat __________ times. Complete this exercise __________ times per day.  STRENGTH - Ulnar Deviators  Stand with a ____________________  weight in your right / left hand, or sit holding on to the rubber exercise band/tubing with your opposite arm supported.  Move your wrist so that your pinkie travels toward your forearm and your thumb moves away from your forearm.  Hold this position for __________ seconds and then slowly lower the wrist back to the starting position. Repeat __________ times. Complete this exercise __________ times per day STRENGTH - Radial Deviators  Stand with a ____________________ weight in your  right / left hand, or sit holding on to the rubber exercise band/tubing with your arm supported.  Raise your hand upward in front of you or pull up on the rubber tubing.  Hold this position for __________ seconds and then slowly lower the wrist back to the starting position. Repeat __________ times. Complete this exercise __________ times per day. STRENGTH - Forearm Supinators  Sit with your right / left forearm supported on a table, keeping your elbow below shoulder height. Rest your hand over the edge, palm down.  Gently grip a hammer or a soup ladle.  Without moving your elbow, slowly turn your palm and hand upward to a "thumbs-up" position.  Hold this position for __________ seconds. Slowly  return to the starting position. Repeat __________ times. Complete this exercise __________ times per day.  STRENGTH - Forearm Pronators  Sit with your right / left forearm supported on a table, keeping your elbow below shoulder height. Rest your hand over the edge, palm up.  Gently grip a hammer or a soup ladle.  Without moving your elbow, slowly turn your palm and hand upward to a "thumbs-up" position.  Hold this position for __________ seconds. Slowly return to the starting position. Repeat __________ times. Complete this exercise __________ times per day.  STRENGTH - Grip  Grasp a tennis ball, a dense sponge, or a large, rolled sock in your hand.  Squeeze as hard as you can without increasing any  pain.  Hold this position for __________ seconds. Release your grip slowly. Repeat __________ times. Complete this exercise __________ times per day.    This information is not intended to replace advice given to you by your health care provider. Make sure you discuss any questions you have with your health care provider.   Document Released: 09/01/2005 Document Revised: 05/23/2015 Document Reviewed: 12/14/2008 Elsevier Interactive Patient Education [employer]

## 2015-11-13 ENCOUNTER — Other Ambulatory Visit: Payer: Self-pay | Admitting: Family Medicine

## 2016-03-12 ENCOUNTER — Encounter: Payer: Self-pay | Admitting: Family Medicine

## 2016-03-12 ENCOUNTER — Other Ambulatory Visit: Payer: Self-pay | Admitting: Family Medicine

## 2016-04-23 ENCOUNTER — Other Ambulatory Visit: Payer: Self-pay | Admitting: Family Medicine

## 2016-06-09 ENCOUNTER — Other Ambulatory Visit: Payer: Self-pay | Admitting: Family Medicine

## 2016-08-15 IMAGING — CR DG CHEST 2V
2 series · 2 of 2 positions shown · non-contrast
Comparison: None

CLINICAL DATA: Asthma, wheezing and shortness of breath.

EXAM:
CHEST - 2 VIEW

[chest pa]
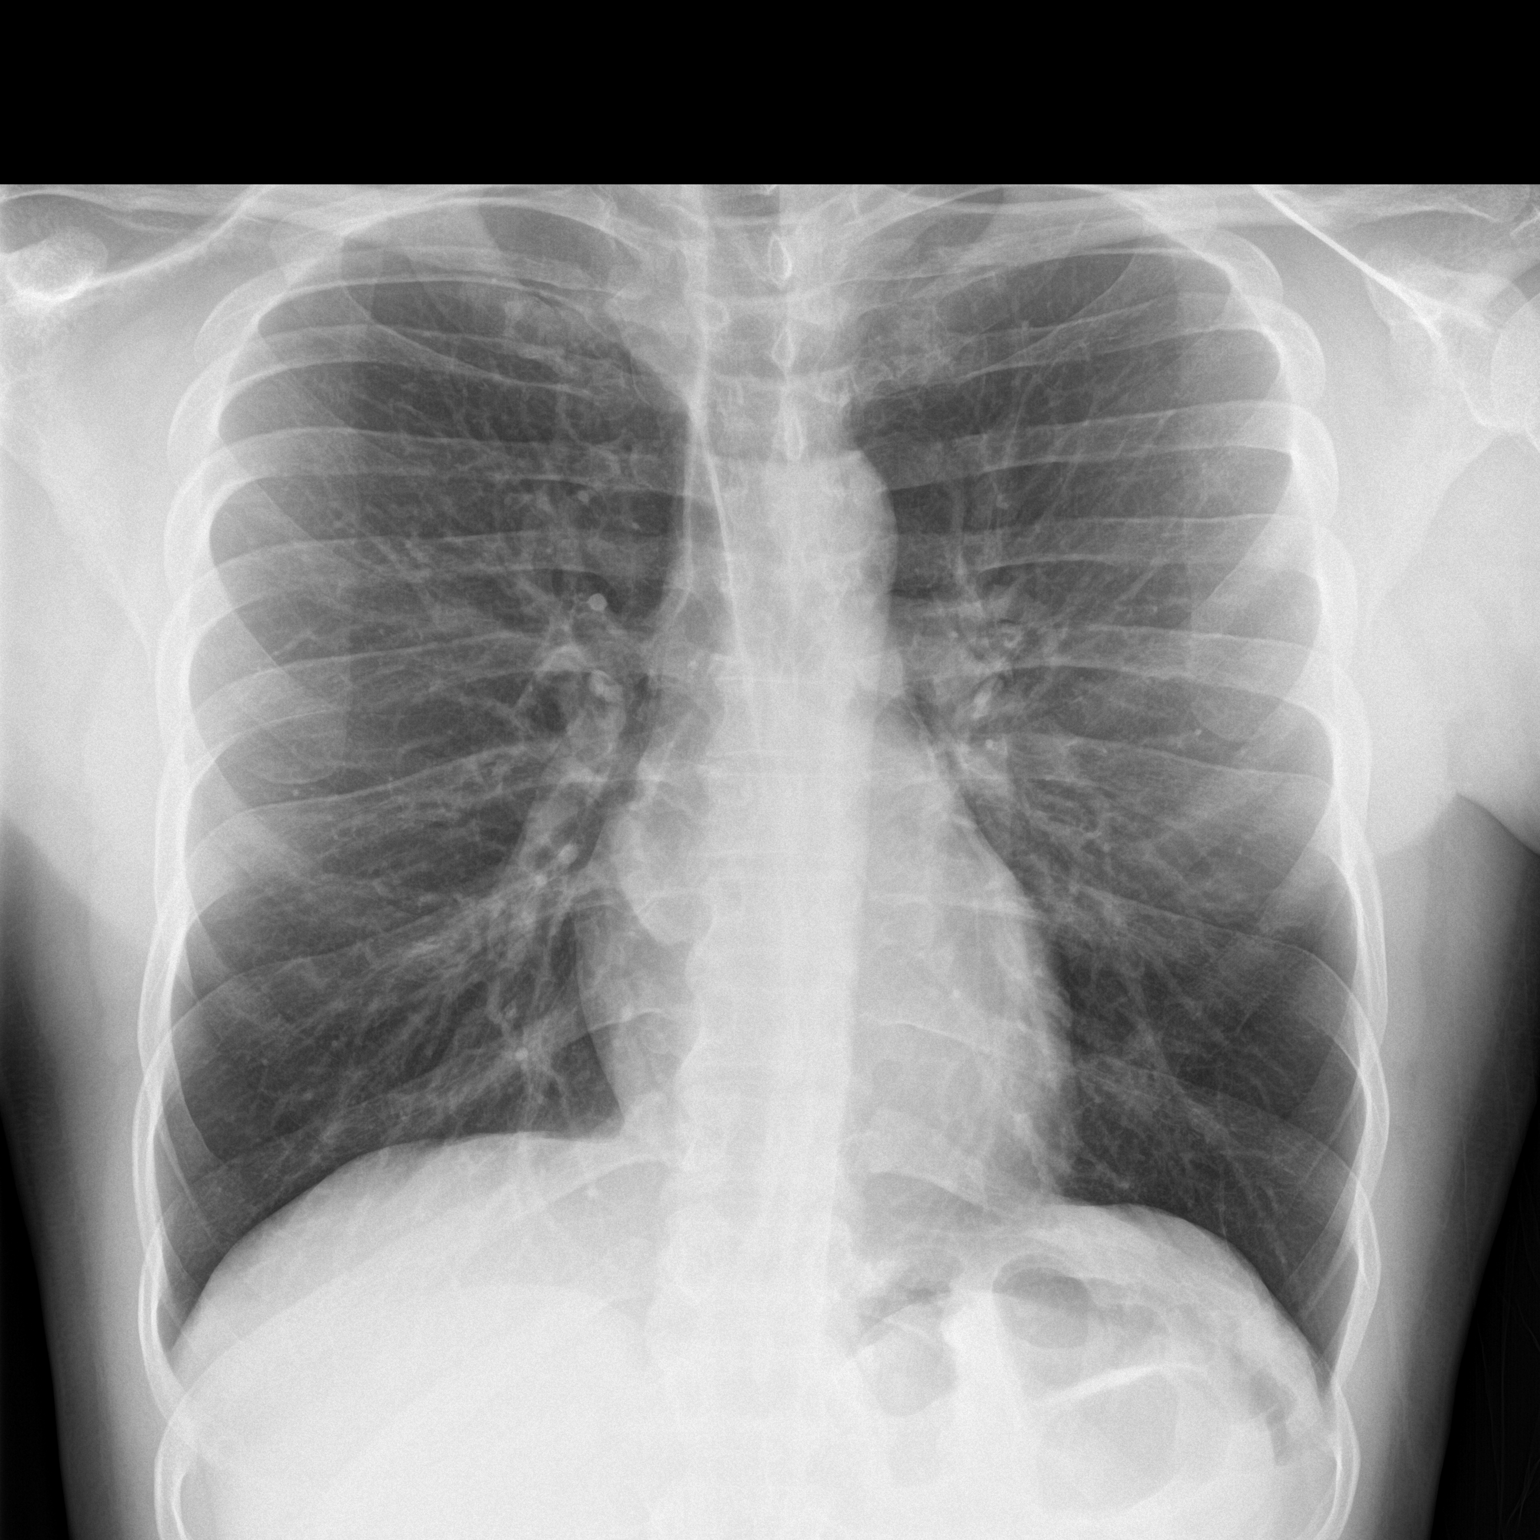

[chest lat]
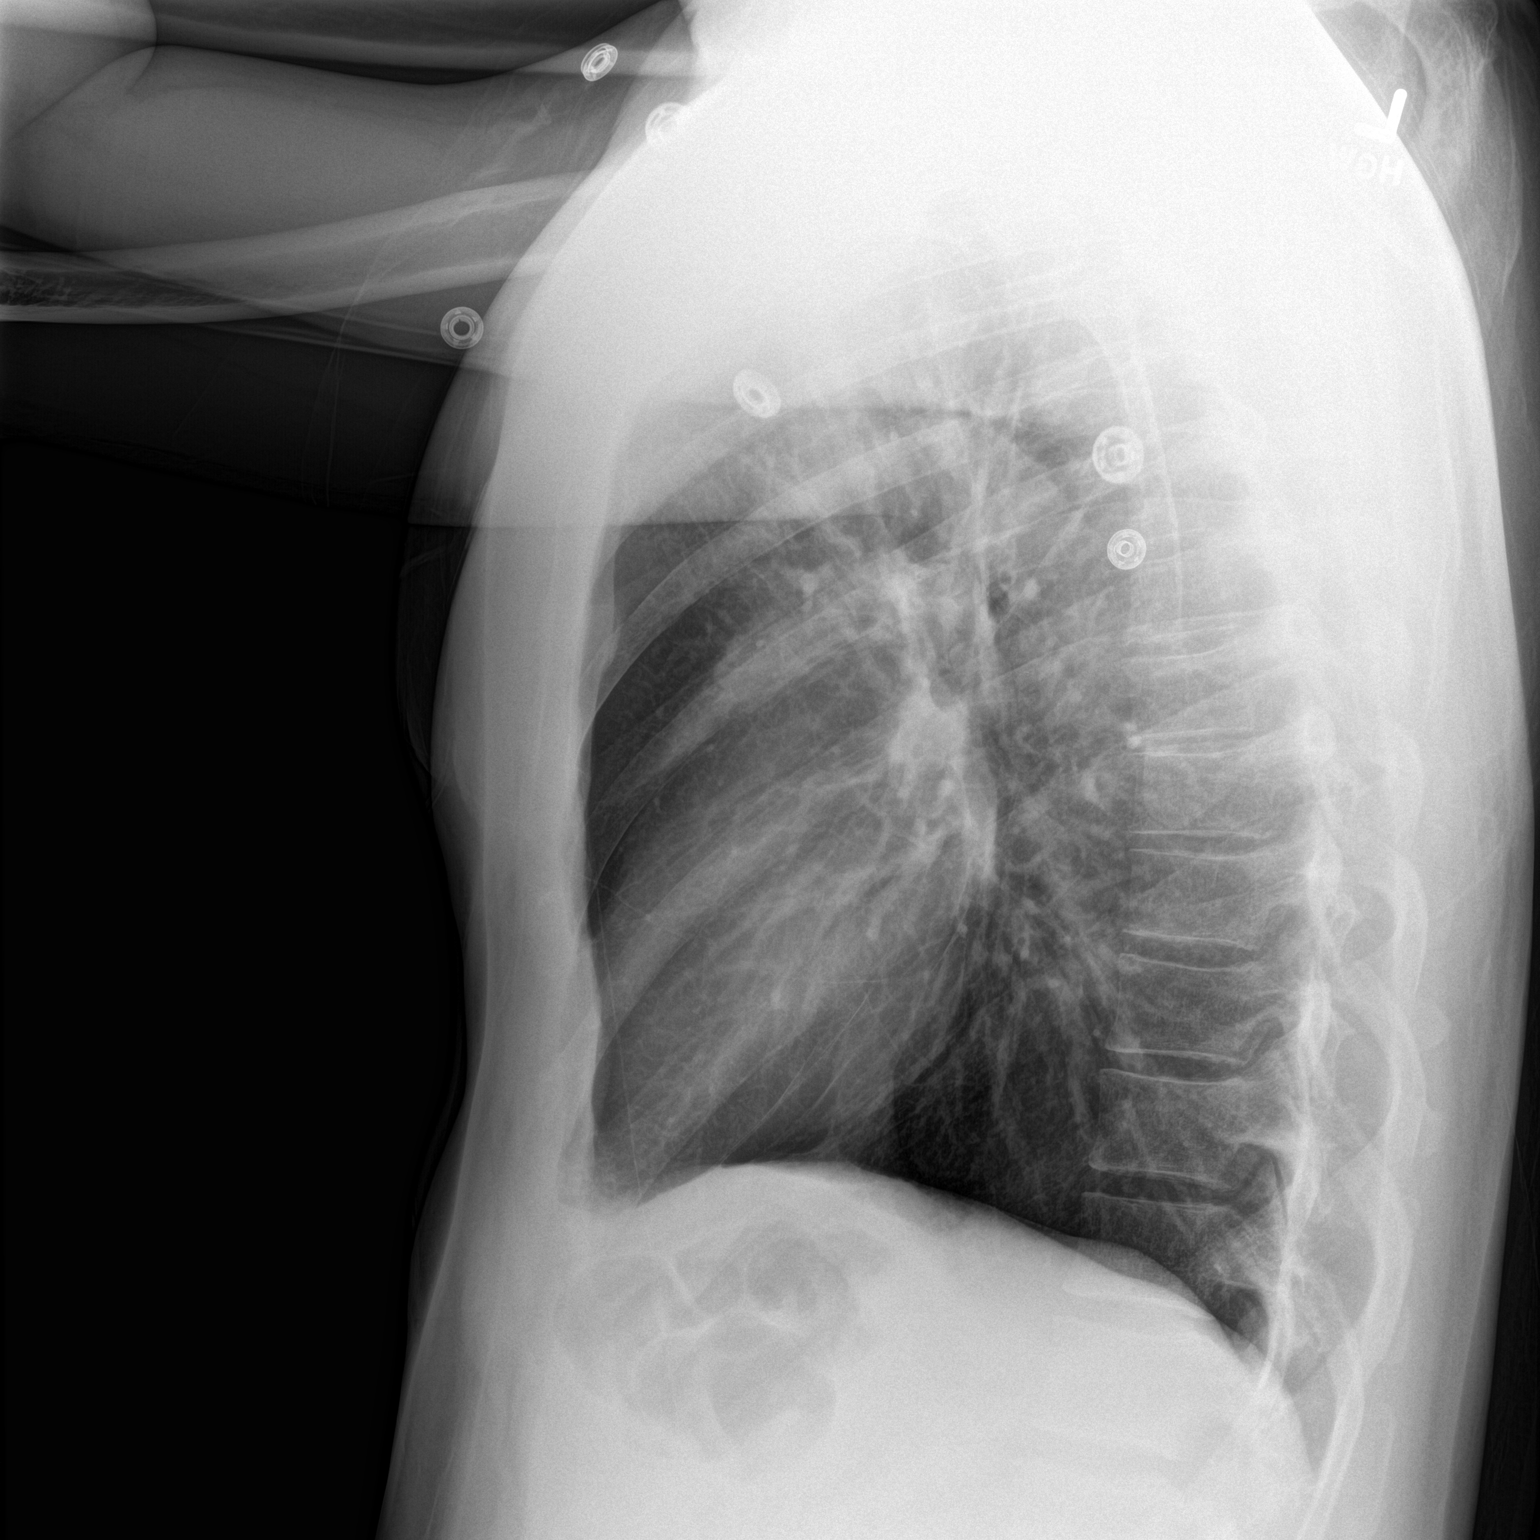

[2 of 2 positions shown; findings below may reference images not displayed]

FINDINGS: Mild bilateral pulmonary hyperinflation and central airway
thickening present likely reflecting asthmatic bronchitis. There is
no evidence of pulmonary edema, focal airspace consolidation,
pneumothorax, nodule or pleural fluid. The heart size and
mediastinal contours are within normal limits. The visualized
skeletal structures are unremarkable.
IMPRESSION: Mild hyperinflation with suggestion of bilateral central bronchial
thickening. Findings are likely consistent with asthmatic
bronchitis.

## 2016-09-01 ENCOUNTER — Other Ambulatory Visit: Payer: Self-pay | Admitting: Physician Assistant

## 2016-09-02 ENCOUNTER — Emergency Department (HOSPITAL_COMMUNITY): Payer: BLUE CROSS/BLUE SHIELD

## 2016-09-02 ENCOUNTER — Encounter (HOSPITAL_COMMUNITY): Payer: Self-pay

## 2016-09-02 ENCOUNTER — Emergency Department (HOSPITAL_COMMUNITY)
Admission: EM | Admit: 2016-09-02 | Discharge: 2016-09-02 | Disposition: A | Discharge status: Home / Self Care | Payer: BLUE CROSS/BLUE SHIELD | Attending: Emergency Medicine | Admitting: Emergency Medicine

## 2016-09-02 DIAGNOSIS — R0602 Shortness of breath: Secondary | ICD-10-CM | POA: Diagnosis present

## 2016-09-02 DIAGNOSIS — J4521 Mild intermittent asthma with (acute) exacerbation: Secondary | ICD-10-CM | POA: Insufficient documentation

## 2016-09-02 DIAGNOSIS — Z79899 Other long term (current) drug therapy: Secondary | ICD-10-CM | POA: Diagnosis not present

## 2016-09-02 DIAGNOSIS — I10 Essential (primary) hypertension: Secondary | ICD-10-CM | POA: Insufficient documentation

## 2016-09-02 MED ORDER — AEROCHAMBER PLUS W/MASK MISC
1.0000 | Freq: Once | Status: AC
  Administered 2016-09-02: 1
  Filled 2016-09-02: qty 1

## 2016-09-02 MED ORDER — ALBUTEROL SULFATE HFA 108 (90 BASE) MCG/ACT IN AERS
6.0000 | INHALATION_SPRAY | Freq: Once | RESPIRATORY_TRACT | Status: AC
  Administered 2016-09-02: 6 via RESPIRATORY_TRACT
  Filled 2016-09-02: qty 6.7

## 2016-09-02 MED ORDER — PREDNISONE 20 MG PO TABS: 60.0000 mg | ORAL_TABLET | Freq: Every day | ORAL | 0 refills | Status: AC

## 2016-09-02 MED ORDER — PREDNISONE 20 MG PO TABS
60.0000 mg | ORAL_TABLET | Freq: Once | ORAL | Status: AC
  Administered 2016-09-02: 60 mg via ORAL
  Filled 2016-09-02: qty 3

## 2016-09-02 MED ORDER — AZITHROMYCIN 250 MG PO TABS: 250.0000 mg | ORAL_TABLET | Freq: Every day | ORAL | 0 refills | Status: DC

## 2016-09-02 NOTE — ED Notes (Signed)
Pt verbalized understanding of d/c instructions and has no further questions. Pt is stable, A&Ox4, VSS.  

## 2016-09-02 NOTE — ED Provider Notes (Signed)
[facility]-EMERGENCY DEPT Provider Note   CSN: [id number] Arrival date & time: 09/02/16  1604  By signing my name below, I, Javier Dockerobert Ryan Halas, attest that this documentation has been prepared under the direction and in the presence of Shaune Pollackameron Leticia Coletta, MD. Electronically Signed: Javier Dockerobert Ryan Halas, ER Scribe. 04/26/2016. 6:06 PM.  History   Chief Complaint Chief Complaint  Patient presents with  . Shortness of Breath   HPI  HPI Comments: Leonor LivWilliam Charrier is a 35 y.o. male who presents to the Emergency Department complaining of two days of SOB, wheezing, and chest tightness. He has a PMHx of asthma. He had nonproductive cough, sore throat and congestion for the last week. He had the same sx for the last two years at the same time. He does not have a christmas tree in his home. He denies swelling in legs, fever, abdominal pain, nausea, vomiting, trouble swallowing. Symptoms are made worse with cold weather and exertion. Denies alleviating factors. He has run out of his inhaler medication.  Past Medical History:  Diagnosis Date  . Asthma    prn inhaler  . Hypertension   . Retained orthopedic hardware 02/2015   left hand    Patient Active Problem List   Diagnosis Date Noted  . Moderate persistent asthma 09/26/2015  . Elevated BP 05/01/2015  . Rhinitis, allergic 04/13/2015    Past Surgical History:  Procedure Laterality Date  . HARDWARE REMOVAL Left 02/19/2015   Procedure: REMOVAL PLATE/SCREWS WITH CAPSULECTOMY;  Surgeon: Dairl PonderMatthew Weingold, MD;  Location: [facility];  Service: Orthopedics;  Laterality: Left;  . OPEN REDUCTION INTERNAL FIXATION (ORIF) METACARPAL Left 07/28/2014   Procedure: OPEN REDUCTION INTERNAL FIXATION (ORIF) LEFT SMALL METACARPAL FRACTURE;  Surgeon: Dairl PonderMatthew Weingold, MD;  Location: [facility];  Service: Orthopedics;  Laterality: Left;  . TENOLYSIS Left 02/19/2015   Procedure: TENOLYSIS LEFT SMALL FINGER METACARPAL ;  Surgeon: Dairl PonderMatthew Weingold, MD;   Location: [facility];  Service: Orthopedics;  Laterality: Left;       Home Medications    Prior to Admission medications   Medication Sig Start Date End Date Taking? Authorizing Provider  amLODipine (NORVASC) 10 MG tablet Take 10 mg by mouth daily. Reported on 09/14/2015    Historical Provider, MD  amLODipine (NORVASC) 5 MG tablet TAKE 1 TABLET BY MOUTH EVERY DAY FOR 7 DAYS THEN TAKE 2 TABLETS EVERY DAY 04/24/16   Peyton Najjaravid H Hopper, MD  azithromycin (ZITHROMAX) 250 MG tablet Take 1 tablet (250 mg total) by mouth daily. Take first 2 tablets together, then 1 every day until finished. 09/02/16   Shaune Pollackameron Damire Remedios, MD  methocarbamol (ROBAXIN) 500 MG tablet Take 1 tablet (500 mg total) by mouth every 8 (eight) hours as needed for muscle spasms. 09/14/15   Ethelda ChickKristi M Smith, MD  predniSONE (DELTASONE) 20 MG tablet Take 3 tablets (60 mg total) by mouth daily with breakfast. 09/02/16 09/07/16  Shaune Pollackameron Sachin Ferencz, MD  QVAR 40 MCG/ACT inhaler INHALE 1 PUFF INTO THE LUNGS BY MOUTH TWICE DAILY Patient not taking: Reported on 11/05/2015 06/07/15   Dorna LeitzNicole V Bush, PA-C  VENTOLIN HFA 108 (90 Base) MCG/ACT inhaler INHALE ONE TO TWO PUFFS BY MOUTH EVERY 4 HOURS AS NEEDED 10/15/15   Porfirio Oarhelle Jeffery, PA-C    Family History Family History  Problem Relation Age of Onset  . Cancer Mother   . Hyperlipidemia Father     Social History Social History  Substance Use Topics  . Smoking status: Never Smoker  . Smokeless tobacco:  Never Used  . Alcohol use Yes     Comment: 1 x/week     Allergies   Lisinopril and Soap   Review of Systems Review of Systems  Constitutional: Positive for fatigue. Negative for chills and fever.  HENT: Negative for congestion and rhinorrhea.   Eyes: Negative for visual disturbance.  Respiratory: Positive for cough, chest tightness, shortness of breath and wheezing.   Cardiovascular: Negative for chest pain and leg swelling.  Gastrointestinal: Negative for abdominal pain,  diarrhea, nausea and vomiting.  Genitourinary: Negative for dysuria and flank pain.  Musculoskeletal: Negative for neck pain and neck stiffness.  Skin: Negative for rash and wound.  Allergic/Immunologic: Negative for immunocompromised state.  Neurological: Negative for syncope, weakness and headaches.  All other systems reviewed and are negative.   Physical Exam Updated Vital Signs BP (!) 159/104 (BP Location: Right Arm)   Pulse 93   Temp 97.8 F (36.6 C) (Oral)   Resp 18   Ht 6' (1.829 m)   Wt 211 lb (95.7 kg)   SpO2 96%   BMI 28.62 kg/m   Physical Exam  Constitutional: He is oriented to person, place, and time. He appears well-developed and well-nourished. No distress.  HENT:  Head: Normocephalic and atraumatic.  Eyes: Conjunctivae are normal. Pupils are equal, round, and reactive to light.  Neck: Neck supple.  Cardiovascular: Normal rate, regular rhythm and normal heart sounds.  Exam reveals no friction rub.   No murmur heard. Pulmonary/Chest: Effort normal. No respiratory distress. He has wheezes (mild, end-expiratory). He has no rales.  Normal WOB, speaking in full sentences  Abdominal: He exhibits no distension.  Musculoskeletal: Normal range of motion. He exhibits no edema.  Neurological: He is alert and oriented to person, place, and time. He exhibits normal muscle tone. Coordination normal.  Skin: Skin is warm and dry. Capillary refill takes less than 2 seconds. He is not diaphoretic.  Psychiatric: He has a normal mood and affect. His behavior is normal.  Nursing note and vitals reviewed.    ED Treatments / Results  Labs (all labs ordered are listed, but only abnormal results are displayed) Labs Reviewed - No data to display  EKG  EKG Interpretation  Date/Time:  Tuesday September 02 2016 19:30:03 EST Ventricular Rate:  87 PR Interval:  170 QRS Duration: 86 QT Interval:  372 QTC Calculation: 447 R Axis:   61 Text Interpretation:  Normal sinus rhythm  Normal ECG No old tracing to compare No St elevations or depressions Confirmed by Erma HeritageISAACS MD, Sheria LangAMERON [phone]([id number]) on 09/02/2016 7:33:24 PM       Radiology Dg Chest 2 View  Result Date: 09/02/2016 CLINICAL DATA:  Shortness of breath and central chest pain EXAM: CHEST  2 VIEW COMPARISON:  02/10/2015 FINDINGS: The heart size and mediastinal contours are within normal limits. Both lungs are clear. The visualized skeletal structures are unremarkable. IMPRESSION: No active cardiopulmonary disease. Electronically Signed   By: Jasmine PangKim  Fujinaga M.D.   On: 09/02/2016 19:15    Procedures Procedures (including critical care time)  Medications Ordered in ED Medications  albuterol (PROVENTIL HFA;VENTOLIN HFA) 108 (90 Base) MCG/ACT inhaler 6 puff (6 puffs Inhalation Given 09/02/16 1859)  aerochamber plus with mask device 1 each (1 each Other Given 09/02/16 1900)  predniSONE (DELTASONE) tablet 60 mg (60 mg Oral Given 09/02/16 1858)     Initial Impression / Assessment and Plan / ED Course  I have reviewed the triage vital signs and the nursing notes.  Pertinent labs &  imaging results that were available during my care of the patient were reviewed by me and considered in my medical decision making (see chart for details).  Clinical Course     35 yo M with PMHx as above who p/w mild cough, wheezing, and SOB with chest tightness. On exam, pt has mild diffuse wheezes. Normal WOB. EKG non-ischemic. CXR clear. Suspect mild asthma exacerbation. Will d/c with steroids, albuterol, and outpt f/u. He is satting well, speaking in full sentences and is well appearing. D/c home.  Final Clinical Impressions(s) / ED Diagnoses   Final diagnoses:  Mild intermittent asthma with acute exacerbation    New Prescriptions New Prescriptions   AZITHROMYCIN (ZITHROMAX) 250 MG TABLET    Take 1 tablet (250 mg total) by mouth daily. Take first 2 tablets together, then 1 every day until finished.   PREDNISONE (DELTASONE) 20 MG  TABLET    Take 3 tablets (60 mg total) by mouth daily with breakfast.    I personally performed the services described in this documentation, which was scribed in my presence. The recorded information has been reviewed and is accurate.       Shaune Pollack, MD 09/02/16 [phone]

## 2016-09-02 NOTE — ED Notes (Signed)
Pt returned from [facility].

## 2016-09-02 NOTE — ED Triage Notes (Signed)
Per Pt, Pt has Hx of asthma. Reports SOB that started on Saturday with some congestion. Hx of the same.

## 2016-09-03 ENCOUNTER — Other Ambulatory Visit: Payer: Self-pay | Admitting: Physician Assistant

## 2016-09-03 ENCOUNTER — Other Ambulatory Visit: Payer: Self-pay | Admitting: Family Medicine

## 2016-09-04 ENCOUNTER — Emergency Department (HOSPITAL_COMMUNITY)
Admission: EM | Admit: 2016-09-04 | Discharge: 2016-09-04 | Disposition: A | Discharge status: Home / Self Care | Payer: BLUE CROSS/BLUE SHIELD | Attending: Emergency Medicine | Admitting: Emergency Medicine

## 2016-09-04 ENCOUNTER — Encounter (HOSPITAL_COMMUNITY): Payer: Self-pay | Admitting: Vascular Surgery

## 2016-09-04 DIAGNOSIS — Z760 Encounter for issue of repeat prescription: Secondary | ICD-10-CM | POA: Insufficient documentation

## 2016-09-04 DIAGNOSIS — Z79899 Other long term (current) drug therapy: Secondary | ICD-10-CM | POA: Insufficient documentation

## 2016-09-04 DIAGNOSIS — I10 Essential (primary) hypertension: Secondary | ICD-10-CM | POA: Insufficient documentation

## 2016-09-04 DIAGNOSIS — J45909 Unspecified asthma, uncomplicated: Secondary | ICD-10-CM | POA: Insufficient documentation

## 2016-09-04 MED ORDER — ALBUTEROL SULFATE HFA 108 (90 BASE) MCG/ACT IN AERS
2.0000 | INHALATION_SPRAY | Freq: Once | RESPIRATORY_TRACT | Status: AC
  Administered 2016-09-04: 2 via RESPIRATORY_TRACT
  Filled 2016-09-04: qty 6.7

## 2016-09-04 MED ORDER — AMLODIPINE BESYLATE 5 MG PO TABS: 10.0000 mg | ORAL_TABLET | Freq: Every day | ORAL | 0 refills | Status: DC

## 2016-09-04 MED ORDER — ALBUTEROL SULFATE HFA 108 (90 BASE) MCG/ACT IN AERS: 2.0000 | INHALATION_SPRAY | RESPIRATORY_TRACT | 0 refills | Status: DC | PRN

## 2016-09-04 MED ORDER — ALBUTEROL SULFATE HFA 108 (90 BASE) MCG/ACT IN AERS: 1.0000 | INHALATION_SPRAY | Freq: Four times a day (QID) | RESPIRATORY_TRACT | 0 refills | Status: DC | PRN

## 2016-09-04 NOTE — Telephone Encounter (Signed)
Meds ordered this encounter  Medications  . amLODipine (NORVASC) 5 MG tablet    Sig: Take 2 tablets (10 mg total) by mouth daily.    Dispense:  60 tablet    Refill:  0    PATIENT NEEDS OFFICE VISIT FOR ADDITIONAL REFILLS

## 2016-09-04 NOTE — ED Triage Notes (Signed)
Pt reports to the ED because he was seen here yesterday and he left his inhaler in the room. Pt denies any new changes in his condition. Simply wants to get the inhaler he left here.

## 2016-09-04 NOTE — ED Notes (Signed)
E-signature not working. Pt understands discharge instructions. 

## 2016-09-04 NOTE — ED Provider Notes (Signed)
[facility] Provider Note   CSN: [id number] Arrival date & time: 09/04/16  0003     History   Chief Complaint Chief Complaint  Patient presents with  . Medication Refill    HPI Greg Stokes is a 35 y.o. male.  Greg Stokes is a 35 y.o. Male who presents to the ED requesting refill on his albuterol inhaler. Patient was seen in the emergency department yesterday and was provided with an albuterol inhaler. He forgot this in the emergency department. He was seen within asthma exacerbation. He reports he is feeling much better. He reports only slight wheezing currently. He denies shortness of breath. No fevers. History of asthma.   The history is provided by the patient and medical records. No language interpreter was used.  Medication Refill    Past Medical History:  Diagnosis Date  . Asthma    prn inhaler  . Hypertension   . Retained orthopedic hardware 02/2015   left hand    Patient Active Problem List   Diagnosis Date Noted  . Moderate persistent asthma 09/26/2015  . Elevated BP 05/01/2015  . Rhinitis, allergic 04/13/2015    Past Surgical History:  Procedure Laterality Date  . HARDWARE REMOVAL Left 02/19/2015   Procedure: REMOVAL PLATE/SCREWS WITH CAPSULECTOMY;  Surgeon: Dairl Ponder, MD;  Location: [facility];  Service: Orthopedics;  Laterality: Left;  . OPEN REDUCTION INTERNAL FIXATION (ORIF) METACARPAL Left 07/28/2014   Procedure: OPEN REDUCTION INTERNAL FIXATION (ORIF) LEFT SMALL METACARPAL FRACTURE;  Surgeon: Dairl Ponder, MD;  Location: [facility];  Service: Orthopedics;  Laterality: Left;  . TENOLYSIS Left 02/19/2015   Procedure: TENOLYSIS LEFT SMALL FINGER METACARPAL ;  Surgeon: Dairl Ponder, MD;  Location: [facility];  Service: Orthopedics;  Laterality: Left;       Home Medications    Prior to Admission medications   Medication Sig Start Date End Date Taking? Authorizing Provider    albuterol (PROVENTIL HFA;VENTOLIN HFA) 108 (90 Base) MCG/ACT inhaler Inhale 1-2 puffs into the lungs every 6 (six) hours as needed for wheezing or shortness of breath. 09/04/16   Everlene Farrier, PA-C  amLODipine (NORVASC) 10 MG tablet Take 10 mg by mouth daily. Reported on 09/14/2015    Historical Provider, MD  amLODipine (NORVASC) 5 MG tablet TAKE 1 TABLET BY MOUTH EVERY DAY FOR 7 DAYS THEN TAKE 2 TABLETS EVERY DAY 04/24/16   Peyton Najjar, MD  azithromycin (ZITHROMAX) 250 MG tablet Take 1 tablet (250 mg total) by mouth daily. Take first 2 tablets together, then 1 every day until finished. 09/02/16   Shaune Pollack, MD  methocarbamol (ROBAXIN) 500 MG tablet Take 1 tablet (500 mg total) by mouth every 8 (eight) hours as needed for muscle spasms. 09/14/15   Ethelda Chick, MD  predniSONE (DELTASONE) 20 MG tablet Take 3 tablets (60 mg total) by mouth daily with breakfast. 09/02/16 09/07/16  Shaune Pollack, MD  QVAR 40 MCG/ACT inhaler INHALE 1 PUFF INTO THE LUNGS BY MOUTH TWICE DAILY Patient not taking: Reported on 11/05/2015 06/07/15   Dorna Leitz, PA-C    Family History Family History  Problem Relation Age of Onset  . Cancer Mother   . Hyperlipidemia Father     Social History Social History  Substance Use Topics  . Smoking status: Never Smoker  . Smokeless tobacco: Never Used  . Alcohol use Yes     Comment: 1 x/week     Allergies   Lisinopril and Soap   Review  of Systems Review of Systems  Constitutional: Negative for fever.  Respiratory: Positive for cough and wheezing. Negative for shortness of breath.   Cardiovascular: Negative for chest pain.  Skin: Negative for rash.  Neurological: Negative for syncope.     Physical Exam Updated Vital Signs BP (!) 147/103 (BP Location: Right Arm)   Pulse 105   Temp 98.3 F (36.8 C) (Oral)   Resp 19   Ht 6' (1.829 m)   Wt 95.7 kg   SpO2 94%   BMI 28.62 kg/m   Physical Exam  Constitutional: He appears well-developed and  well-nourished. No distress.  Nontoxic appearing.  HENT:  Head: Normocephalic and atraumatic.  Eyes: Right eye exhibits no discharge. Left eye exhibits no discharge.  Cardiovascular: Normal rate, regular rhythm, normal heart sounds and intact distal pulses.   Pulmonary/Chest: Effort normal. No respiratory distress. He has wheezes. He has no rales.  Only a slight expiratory wheeze noted. No increased work of breathing. Patient speaking in complete sentences.  Abdominal: Soft. There is no tenderness.  Neurological: He is alert. Coordination normal.  Skin: Skin is warm and dry. Capillary refill takes less than 2 seconds. No rash noted. He is not diaphoretic. No erythema. No pallor.  Psychiatric: He has a normal mood and affect. His behavior is normal.  Nursing note and vitals reviewed.    ED Treatments / Results  Labs (all labs ordered are listed, but only abnormal results are displayed) Labs Reviewed - No data to display  EKG  EKG Interpretation None       Radiology Dg Chest 2 View  Result Date: 09/02/2016 CLINICAL DATA:  Shortness of breath and central chest pain EXAM: CHEST  2 VIEW COMPARISON:  02/10/2015 FINDINGS: The heart size and mediastinal contours are within normal limits. Both lungs are clear. The visualized skeletal structures are unremarkable. IMPRESSION: No active cardiopulmonary disease. Electronically Signed   By: Jasmine PangKim  Fujinaga M.D.   On: 09/02/2016 19:15    Procedures Procedures (including critical care time)  Medications Ordered in ED Medications  albuterol (PROVENTIL HFA;VENTOLIN HFA) 108 (90 Base) MCG/ACT inhaler 2 puff (not administered)     Initial Impression / Assessment and Plan / ED Course  I have reviewed the triage vital signs and the nursing notes.  Pertinent labs & imaging results that were available during my care of the patient were reviewed by me and considered in my medical decision making (see chart for details).  Clinical Course     This is a 35 y.o. Male who presents to the ED requesting refill on his albuterol inhaler. Patient was seen in the emergency department yesterday and was provided with an albuterol inhaler. He forgot this in the emergency department. He was seen within asthma exacerbation. He reports he is feeling much better. He reports only slight wheezing currently. He denies shortness of breath. No fevers. On exam patient is afebrile nontoxic appearing. Only slight expiratory wheeze noted bilaterally. No increased work of breathing. Patient is in complete sentences. Will provide with albuterol inhaler in the emergency department and discharged home with a prescription for 1 refill. I advised the patient to follow-up with their primary care provider this week. I advised the patient to return to the emergency department with new or worsening symptoms or new concerns. The patient verbalized understanding and agreement with plan.    Final Clinical Impressions(s) / ED Diagnoses   Final diagnoses:  Encounter for medication refill    New Prescriptions New Prescriptions   ALBUTEROL (  PROVENTIL HFA;VENTOLIN HFA) 108 (90 BASE) MCG/ACT INHALER    Inhale 1-2 puffs into the lungs every 6 (six) hours as needed for wheezing or shortness of breath.     Everlene FarrierWilliam Jalynne Persico, PA-C 09/04/16 0110    Tomasita CrumbleAdeleke Oni, MD 09/04/16 [phone]

## 2016-09-04 NOTE — ED Notes (Signed)
See EDP assessment 

## 2016-11-04 ENCOUNTER — Encounter (HOSPITAL_COMMUNITY): Payer: Self-pay

## 2016-11-04 ENCOUNTER — Emergency Department (HOSPITAL_COMMUNITY)
Admission: EM | Admit: 2016-11-04 | Discharge: 2016-11-04 | Disposition: A | Discharge status: Home / Self Care | Payer: BLUE CROSS/BLUE SHIELD | Source: Home / Self Care | Attending: Emergency Medicine | Admitting: Emergency Medicine

## 2016-11-04 DIAGNOSIS — I10 Essential (primary) hypertension: Secondary | ICD-10-CM | POA: Insufficient documentation

## 2016-11-04 DIAGNOSIS — Z79899 Other long term (current) drug therapy: Secondary | ICD-10-CM | POA: Insufficient documentation

## 2016-11-04 DIAGNOSIS — J45909 Unspecified asthma, uncomplicated: Secondary | ICD-10-CM

## 2016-11-04 DIAGNOSIS — Z760 Encounter for issue of repeat prescription: Secondary | ICD-10-CM | POA: Insufficient documentation

## 2016-11-04 DIAGNOSIS — J45901 Unspecified asthma with (acute) exacerbation: Secondary | ICD-10-CM | POA: Insufficient documentation

## 2016-11-04 MED ORDER — ALBUTEROL SULFATE (2.5 MG/3ML) 0.083% IN NEBU
5.0000 mg | INHALATION_SOLUTION | Freq: Once | RESPIRATORY_TRACT | Status: AC
  Administered 2016-11-04: 5 mg via RESPIRATORY_TRACT

## 2016-11-04 MED ORDER — BUDESONIDE-FORMOTEROL FUMARATE 80-4.5 MCG/ACT IN AERO: 2.0000 | INHALATION_SPRAY | Freq: Two times a day (BID) | RESPIRATORY_TRACT | 2 refills | Status: DC

## 2016-11-04 MED ORDER — ALBUTEROL SULFATE (2.5 MG/3ML) 0.083% IN NEBU
5.0000 mg | INHALATION_SOLUTION | Freq: Once | RESPIRATORY_TRACT | Status: AC
  Administered 2016-11-04: 5 mg via RESPIRATORY_TRACT
  Filled 2016-11-04: qty 6

## 2016-11-04 MED ORDER — ALBUTEROL SULFATE (2.5 MG/3ML) 0.083% IN NEBU
INHALATION_SOLUTION | RESPIRATORY_TRACT | Status: AC
  Filled 2016-11-04: qty 6

## 2016-11-04 MED ORDER — ALBUTEROL SULFATE HFA 108 (90 BASE) MCG/ACT IN AERS: 1.0000 | INHALATION_SPRAY | Freq: Four times a day (QID) | RESPIRATORY_TRACT | 2 refills | Status: DC | PRN

## 2016-11-04 NOTE — ED Notes (Signed)
Pt reports symbicort ran out 10/29/16. Pt reports being out of rescue inhaler as well.

## 2016-11-04 NOTE — ED Provider Notes (Signed)
[facility] Provider Note   CSN: [id number] Arrival date & time: 11/04/16  1016  By signing my name below, I, [name], attest that this documentation has been prepared under the direction and in the presence of Ok Edwards, [place]. Electronically Signed: [name], Scribe. 11/04/2016. 12:12 PM.    History   Chief Complaint Chief Complaint  Patient presents with  . wants inhaler/congestion   The history is provided by the patient. No language interpreter was used.   HPI Comments:  Greg Stokes is a 36 y.o. male with a PMHx of Asthma and HTN, who presents to the Emergency Department complaining of constant, gradually worsening, SOB onset five days. He reports running out of his inhaler fiver days ago and his symptoms have worsened since then. He has an associated symptom of wheezing that has resolved since the breathing Tx administered here in the ED. Pt requests refill of his albuterol and Symbicort inhalers that gave him mild relief of his symptoms when used at home. Pt denies fever and any other associated symptoms at this time.  Past Medical History:  Diagnosis Date  . Asthma    prn inhaler  . Hypertension   . Retained orthopedic hardware 02/2015   left hand    Patient Active Problem List   Diagnosis Date Noted  . Moderate persistent asthma 09/26/2015  . Elevated BP 05/01/2015  . Rhinitis, allergic 04/13/2015    Past Surgical History:  Procedure Laterality Date  . HARDWARE REMOVAL Left 02/19/2015   Procedure: REMOVAL PLATE/SCREWS WITH CAPSULECTOMY;  Surgeon: Dairl Ponder, MD;  Location: [facility];  Service: Orthopedics;  Laterality: Left;  . OPEN REDUCTION INTERNAL FIXATION (ORIF) METACARPAL Left 07/28/2014   Procedure: OPEN REDUCTION INTERNAL FIXATION (ORIF) LEFT SMALL METACARPAL FRACTURE;  Surgeon: Dairl Ponder, MD;  Location: [facility];  Service: Orthopedics;  Laterality: Left;  . TENOLYSIS Left  02/19/2015   Procedure: TENOLYSIS LEFT SMALL FINGER METACARPAL ;  Surgeon: Dairl Ponder, MD;  Location: [facility];  Service: Orthopedics;  Laterality: Left;       Home Medications    Prior to Admission medications   Medication Sig Start Date End Date Taking? Authorizing Provider  albuterol (PROVENTIL HFA;VENTOLIN HFA) 108 (90 Base) MCG/ACT inhaler Inhale 1-2 puffs into the lungs every 6 (six) hours as needed for wheezing or shortness of breath. 09/04/16   Everlene Farrier, PA-C  albuterol (VENTOLIN HFA) 108 (90 Base) MCG/ACT inhaler Inhale 2 puffs into the lungs every 4 (four) hours as needed for wheezing or shortness of breath. 09/04/16   Chelle Jeffery, PA-C  amLODipine (NORVASC) 10 MG tablet Take 10 mg by mouth daily. Reported on 09/14/2015    Historical Provider, MD  amLODipine (NORVASC) 5 MG tablet Take 2 tablets (10 mg total) by mouth daily. 09/04/16   Chelle Jeffery, PA-C  azithromycin (ZITHROMAX) 250 MG tablet Take 1 tablet (250 mg total) by mouth daily. Take first 2 tablets together, then 1 every day until finished. 09/02/16   Shaune Pollack, MD  methocarbamol (ROBAXIN) 500 MG tablet Take 1 tablet (500 mg total) by mouth every 8 (eight) hours as needed for muscle spasms. 09/14/15   Ethelda Chick, MD  QVAR 40 MCG/ACT inhaler INHALE 1 PUFF INTO THE LUNGS BY MOUTH TWICE DAILY Patient not taking: Reported on 11/05/2015 06/07/15   Dorna Leitz PA-C    Family History Family History  Problem Relation Age of Onset  . Cancer Mother   .  Hyperlipidemia Father     Social History Social History  Substance Use Topics  . Smoking status: Never Smoker  . Smokeless tobacco: Never Used  . Alcohol use Yes     Comment: 1 x/week     Allergies   Lisinopril and Soap   Review of Systems Review of Systems  Constitutional: Negative for fever.  Respiratory: Positive for shortness of breath and wheezing (has since resolved).   All other systems reviewed and are  negative.    Physical Exam Updated Vital Signs BP 176/100 (BP Location: Left Arm)   Pulse 100   Temp 98.8 F (37.1 C) (Oral)   Resp 18   SpO2 96%   Physical Exam  Constitutional: He is oriented to person, place, and time. He appears well-developed and well-nourished.  HENT:  Head: Normocephalic.  Eyes: Conjunctivae are normal.  Cardiovascular: Normal rate.   Pulmonary/Chest: Effort normal and breath sounds normal.  Abdominal: He exhibits no distension.  Musculoskeletal: Normal range of motion.  Neurological: He is alert and oriented to person, place, and time.  Skin: Skin is warm and dry.  Psychiatric: He has a normal mood and affect.  Nursing note and vitals reviewed.    ED Treatments / Results  DIAGNOSTIC STUDIES:  Oxygen Saturation is 96% on RA, adequate by my interpretation.    COORDINATION OF CARE:  12:02 PM Discussed treatment plan with pt at bedside including Breathing Tx and albuterol inhaler & Symbicort inhaler Rx's and pt agreed to plan.  Labs (all labs ordered are listed, but only abnormal results are displayed) Labs Reviewed - No data to display  EKG  EKG Interpretation None       Radiology No results found.  Procedures Procedures (including critical care time)  Medications Ordered in ED Medications - No data to display   Initial Impression / Assessment and Plan / ED Course  I have reviewed the triage vital signs and the nursing notes.  Pertinent labs & imaging results that were available during my care of the patient were reviewed by me and considered in my medical decision making (see chart for details).  Clinical Course as of Nov 04 1132  Tue Nov 04, 2016  1130 DG Chest 2 View [LS]    Clinical Course User Index [LS] Elson AreasLeslie K Sofia, PA-C    Pt given albuterol neb prior to my evaluation.  Pt reports he feels back to his normal  Final Clinical Impressions(s) / ED Diagnoses   Final diagnoses:  Moderate asthma without  complication, unspecified whether persistent    New Prescriptions Discharge Medication List as of 11/04/2016 12:10 PM    START taking these medications   Details  budesonide-formoterol (SYMBICORT) 80-4.5 MCG/ACT inhaler Inhale 2 puffs into the lungs 2 (two) times daily., Starting Tue 11/04/2016, Print       An After Visit Summary was printed and given to the patient.  I personally performed the services in this documentation, which was scribed in my presence.  The recorded information has been reviewed and considered.   Barnet PallKaren SofiaPAC.     Lonia SkinnerLeslie K WallacetonSofia, PA-C 11/04/16 1413    Alvira MondayErin Schlossman, MD 11/06/16 2258

## 2016-11-04 NOTE — Discharge Instructions (Signed)
Return if any problems.

## 2016-11-04 NOTE — ED Triage Notes (Signed)
Pt states he was seen here today for asthma; pt states half of his Rx was missing; pt presents RX of albuterol inhaler but portion with other Rx was missing; pt actively wheezing at triage; Pt a&ox 4 on arrival. Pt denies pain;

## 2016-11-04 NOTE — ED Triage Notes (Signed)
Patient here requesting breathing treatment and inhaler, out x 5 days, no distress, congestion noted, drinking coffee on arrival. Hx of asthma

## 2016-11-05 ENCOUNTER — Emergency Department (HOSPITAL_COMMUNITY)
Admission: EM | Admit: 2016-11-05 | Discharge: 2016-11-05 | Disposition: A | Discharge status: Home / Self Care | Payer: BLUE CROSS/BLUE SHIELD | Attending: Emergency Medicine | Admitting: Emergency Medicine

## 2016-11-05 DIAGNOSIS — J4521 Mild intermittent asthma with (acute) exacerbation: Secondary | ICD-10-CM

## 2016-11-05 DIAGNOSIS — Z760 Encounter for issue of repeat prescription: Secondary | ICD-10-CM

## 2016-11-05 MED ORDER — BUDESONIDE-FORMOTEROL FUMARATE 80-4.5 MCG/ACT IN AERO: 2.0000 | INHALATION_SPRAY | Freq: Two times a day (BID) | RESPIRATORY_TRACT | 0 refills | Status: DC

## 2016-11-05 MED ORDER — PREDNISONE 20 MG PO TABS
60.0000 mg | ORAL_TABLET | Freq: Once | ORAL | Status: AC
Start: 2016-11-05 — End: 2016-11-05
  Administered 2016-11-05: 60 mg via ORAL
  Filled 2016-11-05: qty 3

## 2016-11-05 MED ORDER — IPRATROPIUM-ALBUTEROL 0.5-2.5 (3) MG/3ML IN SOLN
6.0000 mL | Freq: Once | RESPIRATORY_TRACT | Status: AC
  Administered 2016-11-05: 6 mL via RESPIRATORY_TRACT

## 2016-11-05 MED ORDER — PREDNISONE 20 MG PO TABS: 60.0000 mg | ORAL_TABLET | Freq: Every day | ORAL | 0 refills | Status: DC

## 2016-11-05 NOTE — ED Provider Notes (Signed)
[facility] Provider Note   CSN: [id number] Arrival date & time: 11/04/16  2245  By signing my name below, I, Elder Negus, attest that this documentation has been prepared under the direction and in the presence of Tomasita Crumble, MD. Electronically Signed: Elder Negus, Scribe. 11/05/16. 2:42 AM.   History   Chief Complaint Chief Complaint  Patient presents with  . Asthma  . Medication Refill    HPI Greg Stokes is a 36 y.o. male with history of asthma who presents to the ED for evaluation of wheezing in the setting of prescription loss. This patient states that approximately 10 days ago he ran out of his Symbicort therapy. He developed intermittent wheezing 2 days ago. No associated cough, fever, congestion, rhinorrhea. He was seen at this facility yesterday evening where he was provided a refill on his Symbicort and Proventil inhaler; however he states that he lost his Symbicort prescription after returning home. He did fill his albuterol inhaler. He re-presents for another prescription. He denies any chest pain.   The history is provided by the patient. No language interpreter was used.    Past Medical History:  Diagnosis Date  . Asthma    prn inhaler  . Hypertension   . Retained orthopedic hardware 02/2015   left hand    Patient Active Problem List   Diagnosis Date Noted  . Moderate persistent asthma 09/26/2015  . Elevated BP 05/01/2015  . Rhinitis, allergic 04/13/2015    Past Surgical History:  Procedure Laterality Date  . HARDWARE REMOVAL Left 02/19/2015   Procedure: REMOVAL PLATE/SCREWS WITH CAPSULECTOMY;  Surgeon: Dairl Ponder, MD;  Location: [facility];  Service: Orthopedics;  Laterality: Left;  . OPEN REDUCTION INTERNAL FIXATION (ORIF) METACARPAL Left 07/28/2014   Procedure: OPEN REDUCTION INTERNAL FIXATION (ORIF) LEFT SMALL METACARPAL FRACTURE;  Surgeon: Dairl Ponder, MD;  Location: [facility];  Service:  Orthopedics;  Laterality: Left;  . TENOLYSIS Left 02/19/2015   Procedure: TENOLYSIS LEFT SMALL FINGER METACARPAL ;  Surgeon: Dairl Ponder, MD;  Location: [facility];  Service: Orthopedics;  Laterality: Left;       Home Medications    Prior to Admission medications   Medication Sig Start Date End Date Taking? Authorizing Provider  albuterol (PROVENTIL HFA;VENTOLIN HFA) 108 (90 Base) MCG/ACT inhaler Inhale 1-2 puffs into the lungs every 6 (six) hours as needed for wheezing or shortness of breath. 11/04/16   Elson Areas, PA-C  amLODipine (NORVASC) 10 MG tablet Take 10 mg by mouth daily. Reported on 09/14/2015    Historical Provider, MD  amLODipine (NORVASC) 5 MG tablet Take 2 tablets (10 mg total) by mouth daily. 09/04/16   Chelle Jeffery, PA-C  azithromycin (ZITHROMAX) 250 MG tablet Take 1 tablet (250 mg total) by mouth daily. Take first 2 tablets together, then 1 every day until finished. 09/02/16   Shaune Pollack, MD  budesonide-formoterol [facility]) 80-4.5 MCG/ACT inhaler Inhale 2 puffs into the lungs 2 (two) times daily. 11/04/16   Elson Areas, PA-C  methocarbamol (ROBAXIN) 500 MG tablet Take 1 tablet (500 mg total) by mouth every 8 (eight) hours as needed for muscle spasms. 09/14/15   Ethelda Chick, MD  QVAR 40 MCG/ACT inhaler INHALE 1 PUFF INTO THE LUNGS BY MOUTH TWICE DAILY Patient not taking: Reported on 11/05/2015 06/07/15   Dorna Leitz PA-C    Family History Family History  Problem Relation Age of Onset  . Cancer Mother   . Hyperlipidemia Father  Social History Social History  Substance Use Topics  . Smoking status: Never Smoker  . Smokeless tobacco: Never Used  . Alcohol use Yes     Comment: 1 x/week     Allergies   Lisinopril and Soap   Review of Systems Review of Systems 10 Systems reviewed and all are negative for acute change except as noted in the HPI.  Physical Exam Updated Vital Signs BP 152/97   Pulse 73   Temp 98.5 F  (36.9 C) (Oral)   Resp 18   SpO2 97%   Physical Exam  Constitutional: He is oriented to person, place, and time. Vital signs are normal. He appears well-developed and well-nourished.  Non-toxic appearance. He does not appear ill. No distress.  HENT:  Head: Normocephalic and atraumatic.  Nose: Nose normal.  Mouth/Throat: Oropharynx is clear and moist. No oropharyngeal exudate.  Eyes: Conjunctivae and EOM are normal. Pupils are equal, round, and reactive to light. No scleral icterus.  Neck: Normal range of motion. Neck supple. No tracheal deviation, no edema, no erythema and normal range of motion present. No thyroid mass and no thyromegaly present.  Cardiovascular: Normal rate, regular rhythm, S1 normal, S2 normal, normal heart sounds, intact distal pulses and normal pulses.  Exam reveals no gallop and no friction rub.   No murmur heard. Pulmonary/Chest: Effort normal. No respiratory distress. He has no rhonchi. He has no rales.  There is mild, intermittent wheezing in the bilateral, posterior upper and lower lung fields.   Abdominal: Soft. Normal appearance and bowel sounds are normal. He exhibits no distension, no ascites and no mass. There is no hepatosplenomegaly. There is no tenderness. There is no rebound, no guarding and no CVA tenderness.  Musculoskeletal: Normal range of motion. He exhibits no edema or tenderness.  Lymphadenopathy:    He has no cervical adenopathy.  Neurological: He is alert and oriented to person, place, and time. He has normal strength. No cranial nerve deficit or sensory deficit.  Skin: Skin is warm, dry and intact. No petechiae and no rash noted. He is not diaphoretic. No erythema. No pallor.  Nursing note and vitals reviewed.    ED Treatments / Results  DIAGNOSTIC STUDIES: Oxygen Saturation is 97 percent on room air which is normal by my interpretation.    COORDINATION OF CARE: 2:43 AM Discussed treatment plan with pt at bedside and pt agreed to  plan.  Labs (all labs ordered are listed, but only abnormal results are displayed) Labs Reviewed - No data to display  EKG  EKG Interpretation None       Radiology No results found.  Procedures Procedures (including critical care time)  Medications Ordered in ED Medications  albuterol (PROVENTIL) (2.5 MG/3ML) 0.083% nebulizer solution 5 mg (5 mg Nebulization Given 11/04/16 2258)     Initial Impression / Assessment and Plan / ED Course  I have reviewed the triage vital signs and the nursing notes.  Pertinent labs & imaging results that were available during my care of the patient were reviewed by me and considered in my medical decision making (see chart for details).    Patient presents to the ED for asthma exacerbation and losing his rx for symbicort.  Will give him a refill.  He is mildly wheezing again.  Will give 2 more duonebs and prednisone.  Also send home with rx for prednisone.  PCP fu advised within 3 days.  History not consistent with any pneumonia, do not need cxr today.  He appears  well and in NAD.  Patient safe for DC.     Final Clinical Impressions(s) / ED Diagnoses   Final diagnoses:  None    New Prescriptions New Prescriptions   No medications on file     I personally performed the services described in this documentation, which was scribed in my presence. The recorded information has been reviewed and is accurate.      Tomasita CrumbleAdeleke Leathie Weich, MD 11/05/16 [phone]

## 2016-11-24 ENCOUNTER — Emergency Department (HOSPITAL_COMMUNITY)
Admission: EM | Admit: 2016-11-24 | Discharge: 2016-11-24 | Disposition: A | Discharge status: Home / Self Care | Payer: BLUE CROSS/BLUE SHIELD | Attending: Emergency Medicine | Admitting: Emergency Medicine

## 2016-11-24 ENCOUNTER — Emergency Department (HOSPITAL_COMMUNITY): Payer: BLUE CROSS/BLUE SHIELD

## 2016-11-24 DIAGNOSIS — J4521 Mild intermittent asthma with (acute) exacerbation: Secondary | ICD-10-CM | POA: Insufficient documentation

## 2016-11-24 DIAGNOSIS — I10 Essential (primary) hypertension: Secondary | ICD-10-CM | POA: Insufficient documentation

## 2016-11-24 DIAGNOSIS — Z79899 Other long term (current) drug therapy: Secondary | ICD-10-CM | POA: Insufficient documentation

## 2016-11-24 MED ORDER — ALBUTEROL SULFATE HFA 108 (90 BASE) MCG/ACT IN AERS
1.0000 | INHALATION_SPRAY | RESPIRATORY_TRACT | Status: DC | PRN
  Administered 2016-11-24: 2 via RESPIRATORY_TRACT
  Filled 2016-11-24: qty 6.7

## 2016-11-24 MED ORDER — ALBUTEROL SULFATE (2.5 MG/3ML) 0.083% IN NEBU
5.0000 mg | INHALATION_SOLUTION | Freq: Once | RESPIRATORY_TRACT | Status: AC
  Administered 2016-11-24: 5 mg via RESPIRATORY_TRACT
  Filled 2016-11-24: qty 6

## 2016-11-24 MED ORDER — ALBUTEROL (5 MG/ML) CONTINUOUS INHALATION SOLN
10.0000 mg/h | INHALATION_SOLUTION | RESPIRATORY_TRACT | Status: DC
  Administered 2016-11-24: 10 mg/h via RESPIRATORY_TRACT
  Filled 2016-11-24: qty 20

## 2016-11-24 MED ORDER — PREDNISONE 20 MG PO TABS: ORAL_TABLET | ORAL | 0 refills | Status: DC

## 2016-11-24 MED ORDER — METHYLPREDNISOLONE SODIUM SUCC 125 MG IJ SOLR
125.0000 mg | Freq: Once | INTRAMUSCULAR | Status: AC
  Administered 2016-11-24: 125 mg via INTRAMUSCULAR
  Filled 2016-11-24: qty 2

## 2016-11-24 NOTE — ED Triage Notes (Signed)
Pt states that he has been SOB all day with a wet cough. Hx of asthma. Audible wheezing. Alert and oriented.

## 2016-11-24 NOTE — ED Provider Notes (Signed)
[facility] DEPT Provider Note   CSN: [id number] Arrival date & time: 11/24/16  2058     History   Chief Complaint Chief Complaint  Patient presents with  . Shortness of Breath  . Asthma    HPI Greg Stokes is a 36 y.o. male.  HPI Patient with history of asthma. No intubations or ICU admissions. Has had increased shortness of breath, wheezing and productive cough starting this morning. States he ran out of his rescue inhaler. Denies any fever or chills. Received breathing treatment in triage with improvement of his symptoms. Past Medical History:  Diagnosis Date  . Asthma    prn inhaler  . Hypertension   . Retained orthopedic hardware 02/2015   left hand    Patient Active Problem List   Diagnosis Date Noted  . Moderate persistent asthma 09/26/2015  . Elevated BP 05/01/2015  . Rhinitis, allergic 04/13/2015    Past Surgical History:  Procedure Laterality Date  . HARDWARE REMOVAL Left 02/19/2015   Procedure: REMOVAL PLATE/SCREWS WITH CAPSULECTOMY;  Surgeon: Dairl Ponder, MD;  Location: [facility];  Service: Orthopedics;  Laterality: Left;  . OPEN REDUCTION INTERNAL FIXATION (ORIF) METACARPAL Left 07/28/2014   Procedure: OPEN REDUCTION INTERNAL FIXATION (ORIF) LEFT SMALL METACARPAL FRACTURE;  Surgeon: Dairl Ponder, MD;  Location: [facility];  Service: Orthopedics;  Laterality: Left;  . TENOLYSIS Left 02/19/2015   Procedure: TENOLYSIS LEFT SMALL FINGER METACARPAL ;  Surgeon: Dairl Ponder, MD;  Location: [facility];  Service: Orthopedics;  Laterality: Left;       Home Medications    Prior to Admission medications   Medication Sig Start Date End Date Taking? Authorizing Provider  budesonide-formoterol (SYMBICORT) 80-4.5 MCG/ACT inhaler Inhale 2 puffs into the lungs 2 (two) times daily. 11/05/16  Yes Tomasita Crumble, MD  methocarbamol (ROBAXIN) 500 MG tablet Take 1 tablet (500 mg total) by mouth every 8 (eight) hours  as needed for muscle spasms. 09/14/15  Yes Ethelda Chick, MD  Multiple Vitamin (MULTIVITAMIN WITH MINERALS) TABS tablet Take 1 tablet by mouth daily.   Yes Historical Provider, MD  albuterol (PROVENTIL HFA;VENTOLIN HFA) 108 (90 Base) MCG/ACT inhaler Inhale 1-2 puffs into the lungs every 6 (six) hours as needed for wheezing or shortness of breath. Patient not taking: Reported on 11/24/2016 11/04/16   Elson Areas, PA-C  amLODipine (NORVASC) 5 MG tablet Take 2 tablets (10 mg total) by mouth daily. Patient not taking: Reported on 11/24/2016 09/04/16   Porfirio Oar, PA-C  azithromycin (ZITHROMAX) 250 MG tablet Take 1 tablet (250 mg total) by mouth daily. Take first 2 tablets together, then 1 every day until finished. Patient not taking: Reported on 11/24/2016 09/02/16   Shaune Pollack, MD  predniSONE (DELTASONE) 20 MG tablet 3 tabs po day one, then 2 po daily x 4 days 11/24/16   Loren Racer, MD  QVAR 40 MCG/ACT inhaler INHALE 1 PUFF INTO THE LUNGS BY MOUTH TWICE DAILY Patient not taking: Reported on 11/05/2015 06/07/15   [place] Mam    Family History Family History  Problem Relation Age of Onset  . Cancer Mother   . Hyperlipidemia Father     Social History Social History  Substance Use Topics  . Smoking status: Never Smoker  . Smokeless tobacco: Never Used  . Alcohol use Yes     Comment: 1 x/week     Allergies   Lisinopril and Soap   Review of Systems Review of Systems  Constitutional: Negative for  chills and fever.  HENT: Negative for congestion, rhinorrhea and sore throat.   Respiratory: Positive for cough, shortness of breath and wheezing.   Cardiovascular: Negative for chest pain, palpitations and leg swelling.  Gastrointestinal: Negative for abdominal pain, constipation, diarrhea, nausea and vomiting.  Musculoskeletal: Negative for back pain, myalgias, neck pain and neck stiffness.  Skin: Negative for rash and wound.  Neurological: Negative for dizziness,  weakness, light-headedness, numbness and headaches.  All other systems reviewed and are negative.    Physical Exam Updated Vital Signs BP 126/82 (BP Location: Right Arm)   Pulse 114   Temp 98.3 F (36.8 C) (Oral)   Resp 20   Ht 6' (1.829 m)   Wt 205 lb (93 kg)   SpO2 97%   BMI 27.80 kg/m   Physical Exam  Constitutional: He is oriented to person, place, and time. He appears well-developed and well-nourished. No distress.  HENT:  Head: Normocephalic and atraumatic.  Mouth/Throat: Oropharynx is clear and moist. No oropharyngeal exudate.  Eyes: EOM are normal. Pupils are equal, round, and reactive to light.  Neck: Normal range of motion. Neck supple.  Cardiovascular: Normal rate and regular rhythm.   Pulmonary/Chest: Effort normal. He has wheezes (scattered expiratory wheezes). He exhibits no tenderness.  Diminished breath sounds bilateral bases.  Abdominal: Soft. Bowel sounds are normal. There is no tenderness. There is no rebound and no guarding.  Musculoskeletal: Normal range of motion. He exhibits no edema or tenderness.  No lower extremity swelling, asymmetry or tenderness.  Neurological: He is alert and oriented to person, place, and time.  Moving all extremities without deficit. Sensation is fully intact.  Skin: Skin is warm and dry. No rash noted. No erythema.  Psychiatric: He has a normal mood and affect. His behavior is normal.  Nursing note and vitals reviewed.    ED Treatments / Results  Labs (all labs ordered are listed, but only abnormal results are displayed) Labs Reviewed - No data to display  EKG  EKG Interpretation None       Radiology Dg Chest 2 View  Result Date: 11/24/2016 CLINICAL DATA:  Asthma, wheezing, cough, short of breath for past several hours EXAM: CHEST  2 VIEW COMPARISON:  09/02/2016 FINDINGS: The heart size and mediastinal contours are within normal limits. Both lungs are clear. The visualized skeletal structures are unremarkable.  IMPRESSION: No active cardiopulmonary disease. Electronically Signed   By: Burman NievesWilliam  Stevens M.D.   On: 11/24/2016 21:33    Procedures Procedures (including critical care time)  Medications Ordered in ED Medications  albuterol (PROVENTIL,VENTOLIN) solution continuous neb (0 mg/hr Nebulization Stopped 11/24/16 2317)  albuterol (PROVENTIL HFA;VENTOLIN HFA) 108 (90 Base) MCG/ACT inhaler 1-2 puff (2 puffs Inhalation Given 11/24/16 2244)  albuterol (PROVENTIL) (2.5 MG/3ML) 0.083% nebulizer solution 5 mg (5 mg Nebulization Given 11/24/16 2111)  methylPREDNISolone sodium succinate (SOLU-MEDROL) 125 mg/2 mL injection 125 mg (125 mg Intramuscular Given 11/24/16 2243)     Initial Impression / Assessment and Plan / ED Course  I have reviewed the triage vital signs and the nursing notes.  Pertinent labs & imaging results that were available during my care of the patient were reviewed by me and considered in my medical decision making (see chart for details).    Patient states he is feeling much better after continuous nebulized treatment. Given MDI in the emergency department. We'll discharge home with short course of prednisone. Advised to follow with his primary physician. Return precautions given.  Final Clinical Impressions(s) / ED  Diagnoses   Final diagnoses:  Exacerbation of intermittent asthma, unspecified asthma severity    New Prescriptions Discharge Medication List as of 11/24/2016 11:20 PM       Loren Racer, MD 11/25/16 [phone]

## 2017-01-02 ENCOUNTER — Encounter (HOSPITAL_COMMUNITY): Payer: Self-pay

## 2017-01-02 ENCOUNTER — Emergency Department (HOSPITAL_COMMUNITY)
Admission: EM | Admit: 2017-01-02 | Discharge: 2017-01-02 | Disposition: A | Discharge status: Home / Self Care | Payer: Self-pay | Attending: Emergency Medicine | Admitting: Emergency Medicine

## 2017-01-02 DIAGNOSIS — R062 Wheezing: Secondary | ICD-10-CM

## 2017-01-02 DIAGNOSIS — J4521 Mild intermittent asthma with (acute) exacerbation: Secondary | ICD-10-CM | POA: Insufficient documentation

## 2017-01-02 DIAGNOSIS — I10 Essential (primary) hypertension: Secondary | ICD-10-CM | POA: Insufficient documentation

## 2017-01-02 MED ORDER — PREDNISONE 20 MG PO TABS: ORAL_TABLET | ORAL | 0 refills | Status: DC

## 2017-01-02 MED ORDER — ALBUTEROL SULFATE (2.5 MG/3ML) 0.083% IN NEBU
5.0000 mg | INHALATION_SOLUTION | Freq: Once | RESPIRATORY_TRACT | Status: AC
  Administered 2017-01-02: 5 mg via RESPIRATORY_TRACT
  Filled 2017-01-02: qty 6

## 2017-01-02 MED ORDER — ALBUTEROL SULFATE HFA 108 (90 BASE) MCG/ACT IN AERS: 2.0000 | INHALATION_SPRAY | RESPIRATORY_TRACT | 0 refills | Status: DC | PRN

## 2017-01-02 MED ORDER — ALBUTEROL SULFATE HFA 108 (90 BASE) MCG/ACT IN AERS
2.0000 | INHALATION_SPRAY | Freq: Once | RESPIRATORY_TRACT | Status: DC
  Filled 2017-01-02: qty 6.7

## 2017-01-02 MED ORDER — PREDNISONE 20 MG PO TABS
60.0000 mg | ORAL_TABLET | Freq: Once | ORAL | Status: AC
  Administered 2017-01-02: 60 mg via ORAL
  Filled 2017-01-02: qty 3

## 2017-01-02 MED ORDER — CETIRIZINE HCL 10 MG PO TABS: 10.0000 mg | ORAL_TABLET | Freq: Every day | ORAL | 0 refills | Status: DC

## 2017-01-02 MED ORDER — IPRATROPIUM BROMIDE 0.02 % IN SOLN
0.5000 mg | Freq: Once | RESPIRATORY_TRACT | Status: AC
  Administered 2017-01-02: 0.5 mg via RESPIRATORY_TRACT
  Filled 2017-01-02: qty 2.5

## 2017-01-02 NOTE — Discharge Instructions (Signed)
Continue to stay well-hydrated. Use Mucinex for cough suppression/expectoration of mucus. May consider using a netipot and flonase as needed for nasal congestion and runny nose. Take daily antihistamines such as zyrtec or allegra to decrease symptoms and frequency of asthma attacks. Take prednisone as directed for your asthma exacerbation, starting tomorrow since you were given today's dose here in the ER tonight. Use inhaler as directed, as needed for cough/chest congestion/wheezing/shortness of breath. Followup with your primary care doctor in 5-7 days for recheck of ongoing symptoms. Return to emergency department for emergent changing or worsening of symptoms.

## 2017-01-02 NOTE — ED Provider Notes (Signed)
[facility] Provider Note   CSN: [id number] Arrival date & time: 01/02/17  1529  By signing my name below, I, Greg Stokes, attest that this documentation has been prepared under the direction and in the presence of [address], [employer]. Electronically Signed: Marnette Burgess Stokes, Scribe. 01/02/2017. 3:51 PM.   History   Chief Complaint Chief Complaint  Patient presents with  . Asthma   The history is provided by the patient and medical records. No language interpreter was used.  Asthma  This is a chronic problem. The current episode started yesterday. The problem occurs constantly. The problem has not changed since onset.Associated symptoms include shortness of breath. Pertinent negatives include no chest pain and no abdominal pain. Exacerbated by: outdoor exposure/weather changes. The symptoms are relieved by medications. Treatments tried: albuterol inhaler. The treatment provided mild relief.     HPI Comments: Greg Stokes is a 36 y.o. male with a PMHx of asthma and HTN, who presents to the Emergency Department complaining of gradual onset asthma exacerbation beginning yesterday. Pt reports the drastic change in weather coupled with the increase in airborne pollen has exacerbated his asthma leading him to be seen in the ED today. Reports this feels the same as prior asthma attacks. Pt complains of mild SOB, chest tightness, dry cough, wheezing, and mild rhinorrhea. He reports he used his inhaler at home earlier today with mild relief but has since run out, and is requesting a refill. Tried an antihistamine last night as well, with some relief. Symptoms aggravated by weather change and outdoor exposure. Pt denies ear pain, ear drainage, sore throat, fever, chills, CP, leg swelling, hemoptysis, abd pain, N/V/D/C, hematuria, dysuria, myalgias, arthralgias, numbness, tingling, focal weakness, rashes, or any other complaints at this time. Upcoming appointment at [facility] on May 5th.  Nonsmoker. No sick contacts.    Past Medical History:  Diagnosis Date  . Asthma    prn inhaler  . Hypertension   . Retained orthopedic hardware 02/2015   left hand   Patient Active Problem List   Diagnosis Date Noted  . Moderate persistent asthma 09/26/2015  . Elevated BP 05/01/2015  . Rhinitis, allergic 04/13/2015   Past Surgical History:  Procedure Laterality Date  . HARDWARE REMOVAL Left 02/19/2015   Procedure: REMOVAL PLATE/SCREWS WITH CAPSULECTOMY;  Surgeon: Dairl Ponder, MD;  Location: [facility];  Service: Orthopedics;  Laterality: Left;  . OPEN REDUCTION INTERNAL FIXATION (ORIF) METACARPAL Left 07/28/2014   Procedure: OPEN REDUCTION INTERNAL FIXATION (ORIF) LEFT SMALL METACARPAL FRACTURE;  Surgeon: Dairl Ponder, MD;  Location: [facility];  Service: Orthopedics;  Laterality: Left;  . TENOLYSIS Left 02/19/2015   Procedure: TENOLYSIS LEFT SMALL FINGER METACARPAL ;  Surgeon: Dairl Ponder, MD;  Location: [facility];  Service: Orthopedics;  Laterality: Left;    Home Medications    Prior to Admission medications   Medication Sig Start Date End Date Taking? Authorizing Provider  albuterol (PROVENTIL HFA;VENTOLIN HFA) 108 (90 Base) MCG/ACT inhaler Inhale 1-2 puffs into the lungs every 6 (six) hours as needed for wheezing or shortness of breath. Patient not taking: Reported on 11/24/2016 11/04/16   Elson Areas, PA-C  amLODipine (NORVASC) 5 MG tablet Take 2 tablets (10 mg total) by mouth daily. Patient not taking: Reported on 11/24/2016 09/04/16   Porfirio Oar, PA-C  azithromycin (ZITHROMAX) 250 MG tablet Take 1 tablet (250 mg total) by mouth daily. Take first 2 tablets together, then 1 every day until finished. Patient not taking:  Reported on 11/24/2016 09/02/16   Shaune Pollack, MD  budesonide-formoterol [facility]) 80-4.5 MCG/ACT inhaler Inhale 2 puffs into the lungs 2 (two) times daily. 11/05/16   Tomasita Crumble, MD    methocarbamol (ROBAXIN) 500 MG tablet Take 1 tablet (500 mg total) by mouth every 8 (eight) hours as needed for muscle spasms. 09/14/15   Ethelda Chick, MD  Multiple Vitamin (MULTIVITAMIN WITH MINERALS) TABS tablet Take 1 tablet by mouth daily.    Historical Provider, MD  predniSONE (DELTASONE) 20 MG tablet 3 tabs po day one, then 2 po daily x 4 days 11/24/16   Loren Racer, MD  QVAR 40 MCG/ACT inhaler INHALE 1 PUFF INTO THE LUNGS BY MOUTH TWICE DAILY Patient not taking: Reported on 11/05/2015 06/07/15   [place] Mam   Family History Family History  Problem Relation Age of Onset  . Cancer Mother   . Hyperlipidemia Father    Social History Social History  Substance Use Topics  . Smoking status: Never Smoker  . Smokeless tobacco: Never Used  . Alcohol use Yes     Comment: 1 x/week    Allergies   Lisinopril and Soap   Review of Systems Review of Systems  Constitutional: Negative for chills and fever.  HENT: Positive for rhinorrhea. Negative for ear discharge, ear pain and sore throat.   Respiratory: Positive for cough, chest tightness, shortness of breath and wheezing.        No hemoptysis  Cardiovascular: Negative for chest pain and leg swelling.  Gastrointestinal: Negative for abdominal pain, constipation, diarrhea, nausea and vomiting.  Genitourinary: Negative for dysuria and hematuria.  Musculoskeletal: Negative for arthralgias and myalgias.  Skin: Negative for color change and rash.  Allergic/Immunologic: Positive for environmental allergies. Negative for immunocompromised state.  Neurological: Negative for weakness and numbness.  Psychiatric/Behavioral: Negative for confusion.    All systems reviewed and are negative for acute change except as noted in the HPI.   Physical Exam Updated Vital Signs BP (!) 145/92 (BP Location: Right Arm)   Pulse 99   Temp 97.9 F (36.6 C) (Oral)   Resp 20   SpO2 97%   Physical Exam  Constitutional: He is oriented to  person, place, and time. Vital signs are normal. He appears well-developed and well-nourished.  Non-toxic appearance. No distress.  Afebrile, nontoxic, NAD  HENT:  Head: Normocephalic and atraumatic.  Mouth/Throat: Oropharynx is clear and moist and mucous membranes are normal.  Eyes: Conjunctivae and EOM are normal. Right eye exhibits no discharge. Left eye exhibits no discharge.  Neck: Normal range of motion. Neck supple.  Cardiovascular: Normal rate, regular rhythm, normal heart sounds and intact distal pulses.  Exam reveals no gallop and no friction rub.   No murmur heard. Pulmonary/Chest: Effort normal. No respiratory distress. He has decreased breath sounds. He has wheezes. He has no rhonchi. He has no rales.  Mildly diminished lung sounds particularly in the lower fields, airway sounds tight, faint inspiratory and expiratory wheezes throughout, no rhonchi or rales, no hypoxia or increased WOB, speaking in full sentences, SpO2 97% on RA   Abdominal: Soft. Normal appearance and bowel sounds are normal. He exhibits no distension. There is no tenderness. There is no rigidity, no rebound, no guarding, no CVA tenderness, no tenderness at McBurney's point and negative Murphy's sign.  Musculoskeletal: Normal range of motion.  Neurological: He is alert and oriented to person, place, and time. He has normal strength. No sensory deficit.  Skin: Skin is warm, dry and  intact. No rash noted.  Psychiatric: He has a normal mood and affect.  Nursing note and vitals reviewed.    ED Treatments / Results  DIAGNOSTIC STUDIES:  Oxygen Saturation is 97% on RA, normal by my interpretation.    COORDINATION OF CARE:  3:47 PM Discussed treatment plan with pt at bedside including breathing Tx, steroids and pt agreed to plan.  Labs (all labs ordered are listed, but only abnormal results are displayed) Labs Reviewed - No data to display  EKG  EKG Interpretation None       Radiology No results  found.  Procedures Procedures (including critical care time)  Medications Ordered in ED Medications  albuterol (PROVENTIL HFA;VENTOLIN HFA) 108 (90 Base) MCG/ACT inhaler 2 puff (not administered)  albuterol (PROVENTIL) (2.5 MG/3ML) 0.083% nebulizer solution 5 mg (5 mg Nebulization Given 01/02/17 1555)  ipratropium (ATROVENT) nebulizer solution 0.5 mg (0.5 mg Nebulization Given 01/02/17 1555)  predniSONE (DELTASONE) tablet 60 mg (60 mg Oral Given 01/02/17 1555)     Initial Impression / Assessment and Plan / ED Course  I have reviewed the triage vital signs and the nursing notes.  Pertinent labs & imaging results that were available during my care of the patient were reviewed by me and considered in my medical decision making (see chart for details).     36 y.o. male here with asthma exacerbation related to the weather changes onset yesterday. Wheezing, mild SOB, chest tightness, dry cough; reports feels like prior asthma exacerbations. Ran out of inhaler earlier today. On exam, mildly diminished lung sounds particularly in lower fields, inspiratory and expiratory wheezing audible faintly, no rhonchi/rales. Otherwise exam benign, VSS, NAD, no respiratory distress. Will give duoneb and prednisone and reassess; doubt need for other emergent work up/labs/imaging. Will reassess shortly  4:34 PM Pt feeling much better and lung sounds greatly improved after duoneb, no longer diminished, and no ongoing wheezing. Will send home with inhaler, and rx inhaler, prednisone x4 days starting tomorrow, and start on daily antihistamine. Advised OTC remedies for symptomatic relief as well. F/up with PCP in 1wk for recheck. I explained the diagnosis and have given explicit precautions to return to the ER including for any other new or worsening symptoms. The patient understands and accepts the medical plan as it's been dictated and I have answered their questions. Discharge instructions concerning home care and  prescriptions have been given. The patient is STABLE and is discharged to home in good condition.   I personally performed the services described in this documentation, which was scribed in my presence. The recorded information has been reviewed and is accurate.   Final Clinical Impressions(s) / ED Diagnoses   Final diagnoses:  Mild intermittent asthma with exacerbation  Wheezing    New Prescriptions New Prescriptions   ALBUTEROL (PROVENTIL HFA;VENTOLIN HFA) 108 (90 BASE) MCG/ACT INHALER    Inhale 2 puffs into the lungs every 4 (four) hours as needed for wheezing or shortness of breath (cough).   CETIRIZINE (ZYRTEC ALLERGY) 10 MG TABLET    Take 1 tablet (10 mg total) by mouth daily.   PREDNISONE (DELTASONE) 20 MG TABLET    3 tabs po daily x 4 days starting 01/03/17     [address] PA-C 01/02/17 1634    Shaune Pollack, MD 01/03/17 1150

## 2017-01-02 NOTE — ED Triage Notes (Signed)
Pt has asthma.  Out of inhaler.  Started having some wheezing yesterday.  Able to talk in full sentences at this time.

## 2017-08-05 ENCOUNTER — Other Ambulatory Visit: Payer: Self-pay | Admitting: Physician Assistant

## 2017-08-12 ENCOUNTER — Ambulatory Visit: Payer: BLUE CROSS/BLUE SHIELD | Admitting: Family Medicine

## 2017-08-12 ENCOUNTER — Other Ambulatory Visit: Payer: Self-pay

## 2017-08-12 ENCOUNTER — Encounter: Payer: Self-pay | Admitting: Family Medicine

## 2017-08-12 VITALS — BP 178/72 | HR 86 | Temp 97.9°F | Resp 16 | Ht 72.0 in | Wt 210.0 lb

## 2017-08-12 DIAGNOSIS — I10 Essential (primary) hypertension: Secondary | ICD-10-CM

## 2017-08-12 MED ORDER — AMLODIPINE BESYLATE 5 MG PO TABS: ORAL_TABLET | ORAL | 5 refills | Status: DC

## 2017-08-12 NOTE — Progress Notes (Signed)
Patient ID: Greg Stokes, male    DOB: Nov 11, 1980  Age: 36 y.o. MRN: [id number]  Chief Complaint  Patient presents with  . Hypertension    running high since 2016, went down after being on meds, then it went back up in July 2017  . Medication Refill    Norvasc 5 mg and Zyrtec    Subjective:   36 year old man with a history of hypertension in the past. He was on amlodipine for a time, but that has been discontinued over the past year. His pressures have started running high again, and he is had some symptoms especially with bitemporal headaches. Some mild chest discomfort. He does not get as much exercise as he used to, having been a former [employer]. He works running a trucking business. He has lost a few pounds, and tends to lose a few more. No major cardiovascular history in the family, but they have some hypertension.  Current allergies, medications, problem list, past/family and social histories reviewed.  Objective:  BP (!) 178/72   Pulse 86   Temp 97.9 F (36.6 C)   Resp 16   Ht 6' (1.829 m)   Wt 210 lb (95.3 kg)   SpO2 100%   BMI 28.48 kg/m   Healthy-appearing young man in no acute distress. Neck supple and nontender. Chest clear. Heart regular without murmurs.  Assessment & Plan:   Assessment: 1. Essential hypertension       Plan: Treat with amlodipine, starting at 5 mg but I expect he will probably need 10 mg. If he persists with high blood pressure probably would add some hydrochlorothiazide to that. On return visit would recommend that he get some baseline labs and possibly an EKG done.  No orders of the defined types were placed in this encounter.   Meds ordered this encounter  Medications  . amLODipine (NORVASC) 5 MG tablet    Sig: Take 5 mg daily. If pressure remains greater than 140 increase to 10 mg daily.    Dispense:  60 tablet    Refill:  5    PATIENT NEEDS OFFICE VISIT FOR ADDITIONAL REFILLS         Patient Instructions   Get  regular exercise  Try to get your weight down a little bit.  Monitor your blood pressures a time or 2 a week. The goal is to be consistently less than 140/90, ideally down around 120/70.  Avoid excessive salt  Amlodipine 5 mg daily, but if pressure still high after a couple weeks increase to 10 mg daily.  Plan to return in 3-4 months for a follow-up check, but if you are persisting with elevated pressures on 10 mg you should return sooner.    IF you received an x-ray today, you will receive an invoice from [facility]. Please contact [facility] at [phone] with questions or concerns regarding your invoice.   IF you received labwork today, you will receive an invoice from [facility]. Please contact [facility] at [phone] with questions or concerns regarding your invoice.   Our billing staff will not be able to assist you with questions regarding bills from these companies.  You will be contacted with the lab results as soon as they are available. The fastest way to get your results is to activate your My Chart account. Instructions are located on the last page of this paperwork. If you have not heard from us regarding the results in 2 weeks, please contact this office.  Return in about 3 months (around 11/12/2017).   HOPPER,DAVID, MD 08/12/2017

## 2017-08-12 NOTE — Patient Instructions (Addendum)
Get regular exercise  Try to get your weight down a little bit.  Monitor your blood pressures a time or 2 a week. The goal is to be consistently less than 140/90, ideally down around 120/70.  Avoid excessive salt  Amlodipine 5 mg daily, but if pressure still high after a couple weeks increase to 10 mg daily.  Plan to return in 3-4 months for a follow-up check, but if you are persisting with elevated pressures on 10 mg you should return sooner.    IF you received an x-ray today, you will receive an invoice from [facility]. Please contact [facility] at [phone] with questions or concerns regarding your invoice.   IF you received labwork today, you will receive an invoice from [facility]. Please contact [facility] at [phone] with questions or concerns regarding your invoice.   Our billing staff will not be able to assist you with questions regarding bills from these companies.  You will be contacted with the lab results as soon as they are available. The fastest way to get your results is to activate your My Chart account. Instructions are located on the last page of this paperwork. If you have not heard from us regarding the results in 2 weeks, please contact this office.

## 2017-08-13 ENCOUNTER — Encounter: Payer: Self-pay | Admitting: Family Medicine

## 2017-10-13 ENCOUNTER — Emergency Department (HOSPITAL_COMMUNITY)
Admission: EM | Admit: 2017-10-13 | Discharge: 2017-10-13 | Disposition: A | Discharge status: Home / Self Care | Payer: BLUE CROSS/BLUE SHIELD | Attending: Emergency Medicine | Admitting: Emergency Medicine

## 2017-10-13 ENCOUNTER — Other Ambulatory Visit: Payer: Self-pay

## 2017-10-13 ENCOUNTER — Emergency Department (HOSPITAL_COMMUNITY): Payer: BLUE CROSS/BLUE SHIELD

## 2017-10-13 ENCOUNTER — Emergency Department (HOSPITAL_COMMUNITY)
Admission: EM | Admit: 2017-10-13 | Discharge: 2017-10-13 | Discharge status: Against Medical Advice | Payer: BLUE CROSS/BLUE SHIELD

## 2017-10-13 ENCOUNTER — Encounter (HOSPITAL_COMMUNITY): Payer: Self-pay | Admitting: *Deleted

## 2017-10-13 DIAGNOSIS — Y929 Unspecified place or not applicable: Secondary | ICD-10-CM | POA: Insufficient documentation

## 2017-10-13 DIAGNOSIS — S3992XA Unspecified injury of lower back, initial encounter: Secondary | ICD-10-CM | POA: Diagnosis present

## 2017-10-13 DIAGNOSIS — I10 Essential (primary) hypertension: Secondary | ICD-10-CM | POA: Insufficient documentation

## 2017-10-13 DIAGNOSIS — S39012A Strain of muscle, fascia and tendon of lower back, initial encounter: Secondary | ICD-10-CM | POA: Insufficient documentation

## 2017-10-13 DIAGNOSIS — Y999 Unspecified external cause status: Secondary | ICD-10-CM | POA: Insufficient documentation

## 2017-10-13 DIAGNOSIS — X58XXXA Exposure to other specified factors, initial encounter: Secondary | ICD-10-CM | POA: Diagnosis not present

## 2017-10-13 DIAGNOSIS — Y939 Activity, unspecified: Secondary | ICD-10-CM | POA: Insufficient documentation

## 2017-10-13 DIAGNOSIS — J45909 Unspecified asthma, uncomplicated: Secondary | ICD-10-CM | POA: Insufficient documentation

## 2017-10-13 DIAGNOSIS — Z79899 Other long term (current) drug therapy: Secondary | ICD-10-CM | POA: Insufficient documentation

## 2017-10-13 MED ORDER — CYCLOBENZAPRINE HCL 10 MG PO TABS: 10.0000 mg | ORAL_TABLET | Freq: Two times a day (BID) | ORAL | 0 refills | Status: DC | PRN

## 2017-10-13 MED ORDER — NAPROXEN 500 MG PO TABS: 500.0000 mg | ORAL_TABLET | Freq: Two times a day (BID) | ORAL | 0 refills | Status: DC

## 2017-10-13 NOTE — Discharge Instructions (Signed)
Please read attached information regarding your condition. Take naproxen and Flexeril to help with pain and stiffness. Apply heat and stretch area as tolerated.  Massaging the area may also help. Follow-up with your primary care provider for further evaluation. Return to ED for worsening symptoms, injuries or falls, severe back pain, numbness in legs, loss of bowel or bladder function.
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
## 2017-10-13 NOTE — ED Notes (Signed)
Patient verbalized understanding of discharge instructions and denies any further needs or questions at this time. VS stable. Patient ambulatory with steady gait.  
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
## 2017-11-10 ENCOUNTER — Ambulatory Visit: Payer: BLUE CROSS/BLUE SHIELD | Admitting: Family Medicine

## 2017-11-10 ENCOUNTER — Encounter: Payer: Self-pay | Admitting: Family Medicine

## 2017-11-10 VITALS — BP 132/82 | HR 86 | Temp 98.5°F | Resp 17 | Ht 72.0 in | Wt 220.0 lb

## 2017-11-10 DIAGNOSIS — Z13220 Encounter for screening for lipoid disorders: Secondary | ICD-10-CM

## 2017-11-10 DIAGNOSIS — Z131 Encounter for screening for diabetes mellitus: Secondary | ICD-10-CM | POA: Diagnosis not present

## 2017-11-10 DIAGNOSIS — I10 Essential (primary) hypertension: Secondary | ICD-10-CM | POA: Diagnosis not present

## 2017-11-10 MED ORDER — AMLODIPINE BESYLATE 5 MG PO TABS: ORAL_TABLET | ORAL | 1 refills | Status: DC

## 2017-11-10 NOTE — Progress Notes (Signed)
Subjective:    Patient ID: Greg Stokes, male    DOB: 03/01/1981, 37 y.o.   MRN: [id number]  HPI [name] is a 37 y.o. male Presents today for: Chief Complaint  Patient presents with  . Follow-up    hbp    Hypertension: Currently taking amlodipine 5 mg daily. Did have elevated readings earlier in January. Was seen in November, had elevated readings since being off amlodipine for 1 year at that time. Did have some bitemporal headaches. Started on medication with plan on follow-up for baseline labs and possible EKG.   Doing ok on current med. Home readings at pharmacy and asthma study usually 130/80's. No new side effects. No leg swelling.    Started back exercising past few weeks. No pain in chest, no DOE with exercise.   Fasting today.   Lab Results  Component Value Date   CREATININE 0.88 02/21/2015   BP Readings from Last 3 Encounters:  11/10/17 132/82  10/13/17 (!) 148/89  10/12/17 (!) 167/95    Patient Active Problem List   Diagnosis Date Noted  . Moderate persistent asthma 09/26/2015  . Elevated BP 05/01/2015  . Rhinitis, allergic 04/13/2015   Past Medical History:  Diagnosis Date  . Asthma    prn inhaler  . Hypertension   . Retained orthopedic hardware 02/2015   left hand   Past Surgical History:  Procedure Laterality Date  . HARDWARE REMOVAL Left 02/19/2015   Procedure: REMOVAL PLATE/SCREWS WITH CAPSULECTOMY;  Surgeon: Dairl Ponder, MD;  Location: [facility];  Service: Orthopedics;  Laterality: Left;  . OPEN REDUCTION INTERNAL FIXATION (ORIF) METACARPAL Left 07/28/2014   Procedure: OPEN REDUCTION INTERNAL FIXATION (ORIF) LEFT SMALL METACARPAL FRACTURE;  Surgeon: Dairl Ponder, MD;  Location: [facility];  Service: Orthopedics;  Laterality: Left;  . TENOLYSIS Left 02/19/2015   Procedure: TENOLYSIS LEFT SMALL FINGER METACARPAL ;  Surgeon: Dairl Ponder, MD;  Location: [facility];  Service: Orthopedics;   Laterality: Left;   Allergies  Allergen Reactions  . Lisinopril Swelling  . Soap Rash    SCENTED SOAPS   Prior to Admission medications   Medication Sig Start Date End Date Taking? Authorizing Provider  albuterol (PROVENTIL HFA;VENTOLIN HFA) 108 (90 Base) MCG/ACT inhaler Inhale 2 puffs into the lungs every 4 (four) hours as needed for wheezing or shortness of breath (cough). 01/02/17  Yes Street, Mercedes, PA-C  amLODipine (NORVASC) 5 MG tablet Take 5 mg daily. If pressure remains greater than 140 increase to 10 mg daily. 08/12/17  Yes Peyton Najjar, MD  budesonide-formoterol [facility]) 80-4.5 MCG/ACT inhaler Inhale 2 puffs into the lungs 2 (two) times daily. 11/05/16  Yes Tomasita Crumble, MD  cyclobenzaprine (FLEXERIL) 10 MG tablet Take 1 tablet (10 mg total) by mouth 2 (two) times daily as needed for muscle spasms. 10/13/17  Yes Khatri, Hina, PA-C  Multiple Vitamin (MULTIVITAMIN WITH MINERALS) TABS tablet Take 1 tablet by mouth daily.   Yes [provider]  naproxen (NAPROSYN) 500 MG tablet Take 1 tablet (500 mg total) by mouth 2 (two) times daily. 10/13/17  Yes Khatri, Hillary Bow, PA-C   Social History   Socioeconomic History  . Marital status: Single    Spouse name: Not on file  . Number of children: Not on file  . Years of education: Not on file  . Highest education level: Not on file  Social Needs  . Financial resource strain: Not on file  . Food insecurity - worry: Not on  file  . Food insecurity - inability: Not on file  . Transportation needs - medical: Not on file  . Transportation needs - non-medical: Not on file  Occupational History  . Not on file  Tobacco Use  . Smoking status: Never Smoker  . Smokeless tobacco: Never Used  Substance and Sexual Activity  . Alcohol use: Yes    Comment: 1 x/week  . Drug use: No  . Sexual activity: Yes  Other Topics Concern  . Not on file  Social History Narrative  . Not on file     Review of Systems  Constitutional: Negative  for fatigue and unexpected weight change.  Eyes: Negative for visual disturbance.  Respiratory: Negative for cough, chest tightness and shortness of breath.   Cardiovascular: Negative for chest pain, palpitations and leg swelling.  Gastrointestinal: Negative for abdominal pain and blood in stool.  Neurological: Negative for dizziness, light-headedness and headaches.      Objective:   Physical Exam  Constitutional: He is oriented to person, place, and time. He appears well-developed and well-nourished.  HENT:  Head: Normocephalic and atraumatic.  Eyes: EOM are normal. Pupils are equal, round, and reactive to light.  Neck: No JVD present. Carotid bruit is not present.  Cardiovascular: Normal rate, regular rhythm and normal heart sounds.  No murmur heard. Pulmonary/Chest: Effort normal and breath sounds normal. He has no rales.  Musculoskeletal: He exhibits no edema.  Neurological: He is alert and oriented to person, place, and time.  Skin: Skin is warm and dry.  Psychiatric: He has a normal mood and affect.  Vitals reviewed.  Vitals:   11/10/17 1138  BP: 132/82  Pulse: 86  Resp: 17  Temp: 98.5 F (36.9 C)  TempSrc: Oral  SpO2: 98%  Weight: 220 lb (99.8 kg)  Height: 6' (1.829 m)       Assessment & Plan:    Greg Stokes is a 37 y.o. male Essential hypertension - Plan: Comprehensive metabolic panel, amLODipine (NORVASC) 5 MG tablet  - Borderline control, but will continue same dose for now as he has recently started exercising. Baseline labs checked, option of increasing amlodipine to 10 mg daily, if remaining in 130 to 140 range. Recheck 6 months  Screening for diabetes mellitus - Plan: Comprehensive metabolic panel  Screening for hyperlipidemia - Plan: Lipid panel   Meds ordered this encounter  Medications  . amLODipine (NORVASC) 5 MG tablet    Sig: Take 5 mg daily. If pressure remains greater than 140 increase to 10 mg daily.    Dispense:  90 tablet    Refill:   1   Patient Instructions    OK to continue same dose of amlodipine for now as exercising should help. If you remain in the high 130's or 140's, then increase to 10 mg per day. Let me know if you make that change and can send in new dose. Otherwise follow up in 6 months with myself or Dr. Alvy Bimler.    Hypertension Hypertension, commonly called high blood pressure, is when the force of blood pumping through the arteries is too strong. The arteries are the blood vessels that carry blood from the heart throughout the body. Hypertension forces the heart to work harder to pump blood and may cause arteries to become narrow or stiff. Having untreated or uncontrolled hypertension can cause heart attacks, strokes, kidney disease, and other problems. A blood pressure reading consists of a higher number over a lower number. Ideally, your blood pressure should be  below 120/80. The first ("top") number is called the systolic pressure. It is a measure of the pressure in your arteries as your heart beats. The second ("bottom") number is called the diastolic pressure. It is a measure of the pressure in your arteries as the heart relaxes. What are the causes? The cause of this condition is not known. What increases the risk? Some risk factors for high blood pressure are under your control. Others are not. Factors you can change  Smoking.  Having type 2 diabetes mellitus, high cholesterol, or both.  Not getting enough exercise or physical activity.  Being overweight.  Having too much fat, sugar, calories, or salt (sodium) in your diet.  Drinking too much alcohol. Factors that are difficult or impossible to change  Having chronic kidney disease.  Having a family history of high blood pressure.  Age. Risk increases with age.  Race. You may be at higher risk if you are African-American.  Gender. Men are at higher risk than women before age 21. After age 110, women are at higher risk than  men.  Having obstructive sleep apnea.  Stress. What are the signs or symptoms? Extremely high blood pressure (hypertensive crisis) may cause:  Headache.  Anxiety.  Shortness of breath.  Nosebleed.  Nausea and vomiting.  Severe chest pain.  Jerky movements you cannot control (seizures).  How is this diagnosed? This condition is diagnosed by measuring your blood pressure while you are seated, with your arm resting on a surface. The cuff of the blood pressure monitor will be placed directly against the skin of your upper arm at the level of your heart. It should be measured at least twice using the same arm. Certain conditions can cause a difference in blood pressure between your right and left arms. Certain factors can cause blood pressure readings to be lower or higher than normal (elevated) for a short period of time:  When your blood pressure is higher when you are in a health care provider's office than when you are at home, this is called white coat hypertension. Most people with this condition do not need medicines.  When your blood pressure is higher at home than when you are in a health care provider's office, this is called masked hypertension. Most people with this condition may need medicines to control blood pressure.  If you have a high blood pressure reading during one visit or you have normal blood pressure with other risk factors:  You may be asked to return on a different day to have your blood pressure checked again.  You may be asked to monitor your blood pressure at home for 1 week or longer.  If you are diagnosed with hypertension, you may have other blood or imaging tests to help your health care provider understand your overall risk for other conditions. How is this treated? This condition is treated by making healthy lifestyle changes, such as eating healthy foods, exercising more, and reducing your alcohol intake. Your health care provider may prescribe  medicine if lifestyle changes are not enough to get your blood pressure under control, and if:  Your systolic blood pressure is above 130.  Your diastolic blood pressure is above 80.  Your personal target blood pressure may vary depending on your medical conditions, your age, and other factors. Follow these instructions at home: Eating and drinking  Eat a diet that is high in fiber and potassium, and low in sodium, added sugar, and fat. An example eating plan is  called the DASH (Dietary Approaches to Stop Hypertension) diet. To eat this way: ? Eat plenty of fresh fruits and vegetables. Try to fill half of your plate at each meal with fruits and vegetables. ? Eat whole grains, such as whole wheat pasta, brown rice, or whole grain bread. Fill about one quarter of your plate with whole grains. ? Eat or drink low-fat dairy products, such as skim milk or low-fat yogurt. ? Avoid fatty cuts of meat, processed or cured meats, and poultry with skin. Fill about one quarter of your plate with lean proteins, such as fish, chicken without skin, beans, eggs, and tofu. ? Avoid premade and processed foods. These tend to be higher in sodium, added sugar, and fat.  Reduce your daily sodium intake. Most people with hypertension should eat less than 1,500 mg of sodium a day.  Limit alcohol intake to no more than 1 drink a day for nonpregnant women and 2 drinks a day for men. One drink equals 12 oz of beer, 5 oz of wine, or 1 oz of hard liquor. Lifestyle  Work with your health care provider to maintain a healthy body weight or to lose weight. Ask what an ideal weight is for you.  Get at least 30 minutes of exercise that causes your heart to beat faster (aerobic exercise) most days of the week. Activities may include walking, swimming, or biking.  Include exercise to strengthen your muscles (resistance exercise), such as pilates or lifting weights, as part of your weekly exercise routine. Try to do these types  of exercises for 30 minutes at least 3 days a week.  Do not use any products that contain nicotine or tobacco, such as cigarettes and e-cigarettes. If you need help quitting, ask your health care provider.  Monitor your blood pressure at home as told by your health care provider.  Keep all follow-up visits as told by your health care provider. This is important. Medicines  Take over-the-counter and prescription medicines only as told by your health care provider. Follow directions carefully. Blood pressure medicines must be taken as prescribed.  Do not skip doses of blood pressure medicine. Doing this puts you at risk for problems and can make the medicine less effective.  Ask your health care provider about side effects or reactions to medicines that you should watch for. Contact a health care provider if:  You think you are having a reaction to a medicine you are taking.  You have headaches that keep coming back (recurring).  You feel dizzy.  You have swelling in your ankles.  You have trouble with your vision. Get help right away if:  You develop a severe headache or confusion.  You have unusual weakness or numbness.  You feel faint.  You have severe pain in your chest or abdomen.  You vomit repeatedly.  You have trouble breathing. Summary  Hypertension is when the force of blood pumping through your arteries is too strong. If this condition is not controlled, it may put you at risk for serious complications.  Your personal target blood pressure may vary depending on your medical conditions, your age, and other factors. For most people, a normal blood pressure is less than 120/80.  Hypertension is treated with lifestyle changes, medicines, or a combination of both. Lifestyle changes include weight loss, eating a healthy, low-sodium diet, exercising more, and limiting alcohol. This information is not intended to replace advice given to you by your health care provider.  Make sure you discuss  any questions you have with your health care provider. Document Released: 09/01/2005 Document Revised: 07/30/2016 Document Reviewed: 07/30/2016 Elsevier Interactive Patient Education  2018 [employer].   IF you received an x-ray today, you will receive an invoice from [facility]. Please contact [facility] at [phone] with questions or concerns regarding your invoice.   IF you received labwork today, you will receive an invoice from [place]. Please contact [facility] at [phone] with questions or concerns regarding your invoice.   Our billing staff will not be able to assist you with questions regarding bills from these companies.  You will be contacted with the lab results as soon as they are available. The fastest way to get your results is to activate your My Chart account. Instructions are located on the last page of this paperwork. If you have not heard from [place] regarding the results in 2 weeks, please contact this office.

## 2017-11-10 NOTE — Patient Instructions (Addendum)
OK to continue same dose of amlodipine for now as exercising should help. If you remain in the high 130's or 140's, then increase to 10 mg per day. Let me know if you make that change and can send in new dose. Otherwise follow up in 6 months with myself or Dr. Alvy BimlerSagardia.    Hypertension Hypertension, commonly called high blood pressure, is when the force of blood pumping through the arteries is too strong. The arteries are the blood vessels that carry blood from the heart throughout the body. Hypertension forces the heart to work harder to pump blood and may cause arteries to become narrow or stiff. Having untreated or uncontrolled hypertension can cause heart attacks, strokes, kidney disease, and other problems. A blood pressure reading consists of a higher number over a lower number. Ideally, your blood pressure should be below 120/80. The first ("top") number is called the systolic pressure. It is a measure of the pressure in your arteries as your heart beats. The second ("bottom") number is called the diastolic pressure. It is a measure of the pressure in your arteries as the heart relaxes. What are the causes? The cause of this condition is not known. What increases the risk? Some risk factors for high blood pressure are under your control. Others are not. Factors you can change  Smoking.  Having type 2 diabetes mellitus, high cholesterol, or both.  Not getting enough exercise or physical activity.  Being overweight.  Having too much fat, sugar, calories, or salt (sodium) in your diet.  Drinking too much alcohol. Factors that are difficult or impossible to change  Having chronic kidney disease.  Having a family history of high blood pressure.  Age. Risk increases with age.  Race. You may be at higher risk if you are African-American.  Gender. Men are at higher risk than women before age 35. After age 35, women are at higher risk than men.  Having obstructive sleep  apnea.  Stress. What are the signs or symptoms? Extremely high blood pressure (hypertensive crisis) may cause:  Headache.  Anxiety.  Shortness of breath.  Nosebleed.  Nausea and vomiting.  Severe chest pain.  Jerky movements you cannot control (seizures).  How is this diagnosed? This condition is diagnosed by measuring your blood pressure while you are seated, with your arm resting on a surface. The cuff of the blood pressure monitor will be placed directly against the skin of your upper arm at the level of your heart. It should be measured at least twice using the same arm. Certain conditions can cause a difference in blood pressure between your right and left arms. Certain factors can cause blood pressure readings to be lower or higher than normal (elevated) for a short period of time:  When your blood pressure is higher when you are in a health care provider's office than when you are at home, this is called white coat hypertension. Most people with this condition do not need medicines.  When your blood pressure is higher at home than when you are in a health care provider's office, this is called masked hypertension. Most people with this condition may need medicines to control blood pressure.  If you have a high blood pressure reading during one visit or you have normal blood pressure with other risk factors:  You may be asked to return on a different day to have your blood pressure checked again.  You may be asked to monitor your blood pressure at home for 1  week or longer.  If you are diagnosed with hypertension, you may have other blood or imaging tests to help your health care provider understand your overall risk for other conditions. How is this treated? This condition is treated by making healthy lifestyle changes, such as eating healthy foods, exercising more, and reducing your alcohol intake. Your health care provider may prescribe medicine if lifestyle changes are  not enough to get your blood pressure under control, and if:  Your systolic blood pressure is above 130.  Your diastolic blood pressure is above 80.  Your personal target blood pressure may vary depending on your medical conditions, your age, and other factors. Follow these instructions at home: Eating and drinking  Eat a diet that is high in fiber and potassium, and low in sodium, added sugar, and fat. An example eating plan is called the DASH (Dietary Approaches to Stop Hypertension) diet. To eat this way: ? Eat plenty of fresh fruits and vegetables. Try to fill half of your plate at each meal with fruits and vegetables. ? Eat whole grains, such as whole wheat pasta, brown rice, or whole grain bread. Fill about one quarter of your plate with whole grains. ? Eat or drink low-fat dairy products, such as skim milk or low-fat yogurt. ? Avoid fatty cuts of meat, processed or cured meats, and poultry with skin. Fill about one quarter of your plate with lean proteins, such as fish, chicken without skin, beans, eggs, and tofu. ? Avoid premade and processed foods. These tend to be higher in sodium, added sugar, and fat.  Reduce your daily sodium intake. Most people with hypertension should eat less than 1,500 mg of sodium a day.  Limit alcohol intake to no more than 1 drink a day for nonpregnant women and 2 drinks a day for men. One drink equals 12 oz of beer, 5 oz of wine, or 1 oz of hard liquor. Lifestyle  Work with your health care provider to maintain a healthy body weight or to lose weight. Ask what an ideal weight is for you.  Get at least 30 minutes of exercise that causes your heart to beat faster (aerobic exercise) most days of the week. Activities may include walking, swimming, or biking.  Include exercise to strengthen your muscles (resistance exercise), such as pilates or lifting weights, as part of your weekly exercise routine. Try to do these types of exercises for 30 minutes at  least 3 days a week.  Do not use any products that contain nicotine or tobacco, such as cigarettes and e-cigarettes. If you need help quitting, ask your health care provider.  Monitor your blood pressure at home as told by your health care provider.  Keep all follow-up visits as told by your health care provider. This is important. Medicines  Take over-the-counter and prescription medicines only as told by your health care provider. Follow directions carefully. Blood pressure medicines must be taken as prescribed.  Do not skip doses of blood pressure medicine. Doing this puts you at risk for problems and can make the medicine less effective.  Ask your health care provider about side effects or reactions to medicines that you should watch for. Contact a health care provider if:  You think you are having a reaction to a medicine you are taking.  You have headaches that keep coming back (recurring).  You feel dizzy.  You have swelling in your ankles.  You have trouble with your vision. Get help right away if:  You  develop a severe headache or confusion.  You have unusual weakness or numbness.  You feel faint.  You have severe pain in your chest or abdomen.  You vomit repeatedly.  You have trouble breathing. Summary  Hypertension is when the force of blood pumping through your arteries is too strong. If this condition is not controlled, it may put you at risk for serious complications.  Your personal target blood pressure may vary depending on your medical conditions, your age, and other factors. For most people, a normal blood pressure is less than 120/80.  Hypertension is treated with lifestyle changes, medicines, or a combination of both. Lifestyle changes include weight loss, eating a healthy, low-sodium diet, exercising more, and limiting alcohol. This information is not intended to replace advice given to you by your health care provider. Make sure you discuss any  questions you have with your health care provider. Document Released: 09/01/2005 Document Revised: 07/30/2016 Document Reviewed: 07/30/2016 Elsevier Interactive Patient Education  2018 [employer].   IF you received an x-ray today, you will receive an invoice from [facility]. Please contact [facility] at [phone] with questions or concerns regarding your invoice.   IF you received labwork today, you will receive an invoice from [facility]. Please contact [facility] at [phone] with questions or concerns regarding your invoice.   Our billing staff will not be able to assist you with questions regarding bills from these companies.  You will be contacted with the lab results as soon as they are available. The fastest way to get your results is to activate your My Chart account. Instructions are located on the last page of this paperwork. If you have not heard from [place] regarding the results in 2 weeks, please contact this office.

## 2017-11-11 LAB — COMPREHENSIVE METABOLIC PANEL
A/G RATIO: 1.3 (ref 1.2–2.2)
ALT: 32 IU/L (ref 0–44)
AST: 25 IU/L (ref 0–40)
Albumin: 4.5 g/dL (ref 3.5–5.5)
Alkaline Phosphatase: 41 IU/L (ref 39–117)
BUN / CREAT RATIO: 13 (ref 9–20)
BUN: 14 mg/dL (ref 6–20)
Bilirubin Total: 0.6 mg/dL (ref 0.0–1.2)
CO2: 22 mmol/L (ref 20–29)
Calcium: 9.3 mg/dL (ref 8.7–10.2)
Chloride: 103 mmol/L (ref 96–106)
Creatinine, Ser: 1.08 mg/dL (ref 0.76–1.27)
GFR calc Af Amer: 102 mL/min/{1.73_m2} (ref >59)
GFR calc non Af Amer: 88 mL/min/{1.73_m2} (ref >59)
GLOBULIN, TOTAL: 3.4 g/dL (ref 1.5–4.5)
Glucose: 91 mg/dL (ref 65–99)
Potassium: 4 mmol/L (ref 3.5–5.2)
Sodium: 140 mmol/L (ref 134–144)
Total Protein: 7.9 g/dL (ref 6.0–8.5)

## 2017-11-11 LAB — LIPID PANEL
CHOL/HDL RATIO: 5 ratio (ref 0.0–5.0)
Cholesterol, Total: 185 mg/dL (ref 100–199)
HDL: 37 mg/dL — AB (ref >39)
LDL CALC: 109 mg/dL — AB (ref 0–99)
Triglycerides: 197 mg/dL — ABNORMAL HIGH (ref 0–149)
VLDL CHOLESTEROL CAL: 39 mg/dL (ref 5–40)

## 2018-01-23 ENCOUNTER — Other Ambulatory Visit: Payer: Self-pay

## 2018-01-23 ENCOUNTER — Encounter (HOSPITAL_COMMUNITY): Payer: Self-pay

## 2018-01-23 ENCOUNTER — Emergency Department (HOSPITAL_COMMUNITY)
Admission: EM | Admit: 2018-01-23 | Discharge: 2018-01-24 | Disposition: A | Discharge status: Home / Self Care | Payer: BLUE CROSS/BLUE SHIELD | Attending: Emergency Medicine | Admitting: Emergency Medicine

## 2018-01-23 ENCOUNTER — Emergency Department (HOSPITAL_COMMUNITY): Payer: BLUE CROSS/BLUE SHIELD

## 2018-01-23 DIAGNOSIS — J45909 Unspecified asthma, uncomplicated: Secondary | ICD-10-CM | POA: Diagnosis not present

## 2018-01-23 DIAGNOSIS — M79642 Pain in left hand: Secondary | ICD-10-CM | POA: Diagnosis not present

## 2018-01-23 DIAGNOSIS — I10 Essential (primary) hypertension: Secondary | ICD-10-CM | POA: Insufficient documentation

## 2018-01-23 DIAGNOSIS — Z79899 Other long term (current) drug therapy: Secondary | ICD-10-CM | POA: Diagnosis not present

## 2018-01-23 DIAGNOSIS — M25561 Pain in right knee: Secondary | ICD-10-CM | POA: Insufficient documentation

## 2018-01-23 NOTE — ED Notes (Signed)
Pt. In xray 
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
## 2018-01-24 MED ORDER — METHOCARBAMOL 500 MG PO TABS: 500.0000 mg | ORAL_TABLET | Freq: Two times a day (BID) | ORAL | 0 refills | Status: DC
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
## 2018-01-24 NOTE — Discharge Instructions (Signed)
X-ray of your wrist and knee were reassuring.  No broken bones.  Please follow-up with your regular doctor in a week if your symptoms are not improving.  I have written you a prescription for muscle relaxer medicine.  This medicine can make you drowsy so please do not drive or work while taking it.  Please also take ibuprofen 600 mg every 6 hours for pain.  Return to the emergency department if you have any new or concerning symptoms like worsening headache with vomiting, headache with trouble with your vision.

## 2018-03-01 ENCOUNTER — Other Ambulatory Visit: Payer: Self-pay | Admitting: Family Medicine

## 2018-03-01 DIAGNOSIS — I10 Essential (primary) hypertension: Secondary | ICD-10-CM

## 2018-03-08 IMAGING — CR DG CHEST 2V
2 series · 2 of 2 positions shown · non-contrast
Comparison: 02/10/2015

CLINICAL DATA: Shortness of breath and central chest pain

EXAM:
CHEST  2 VIEW

[chest pa]
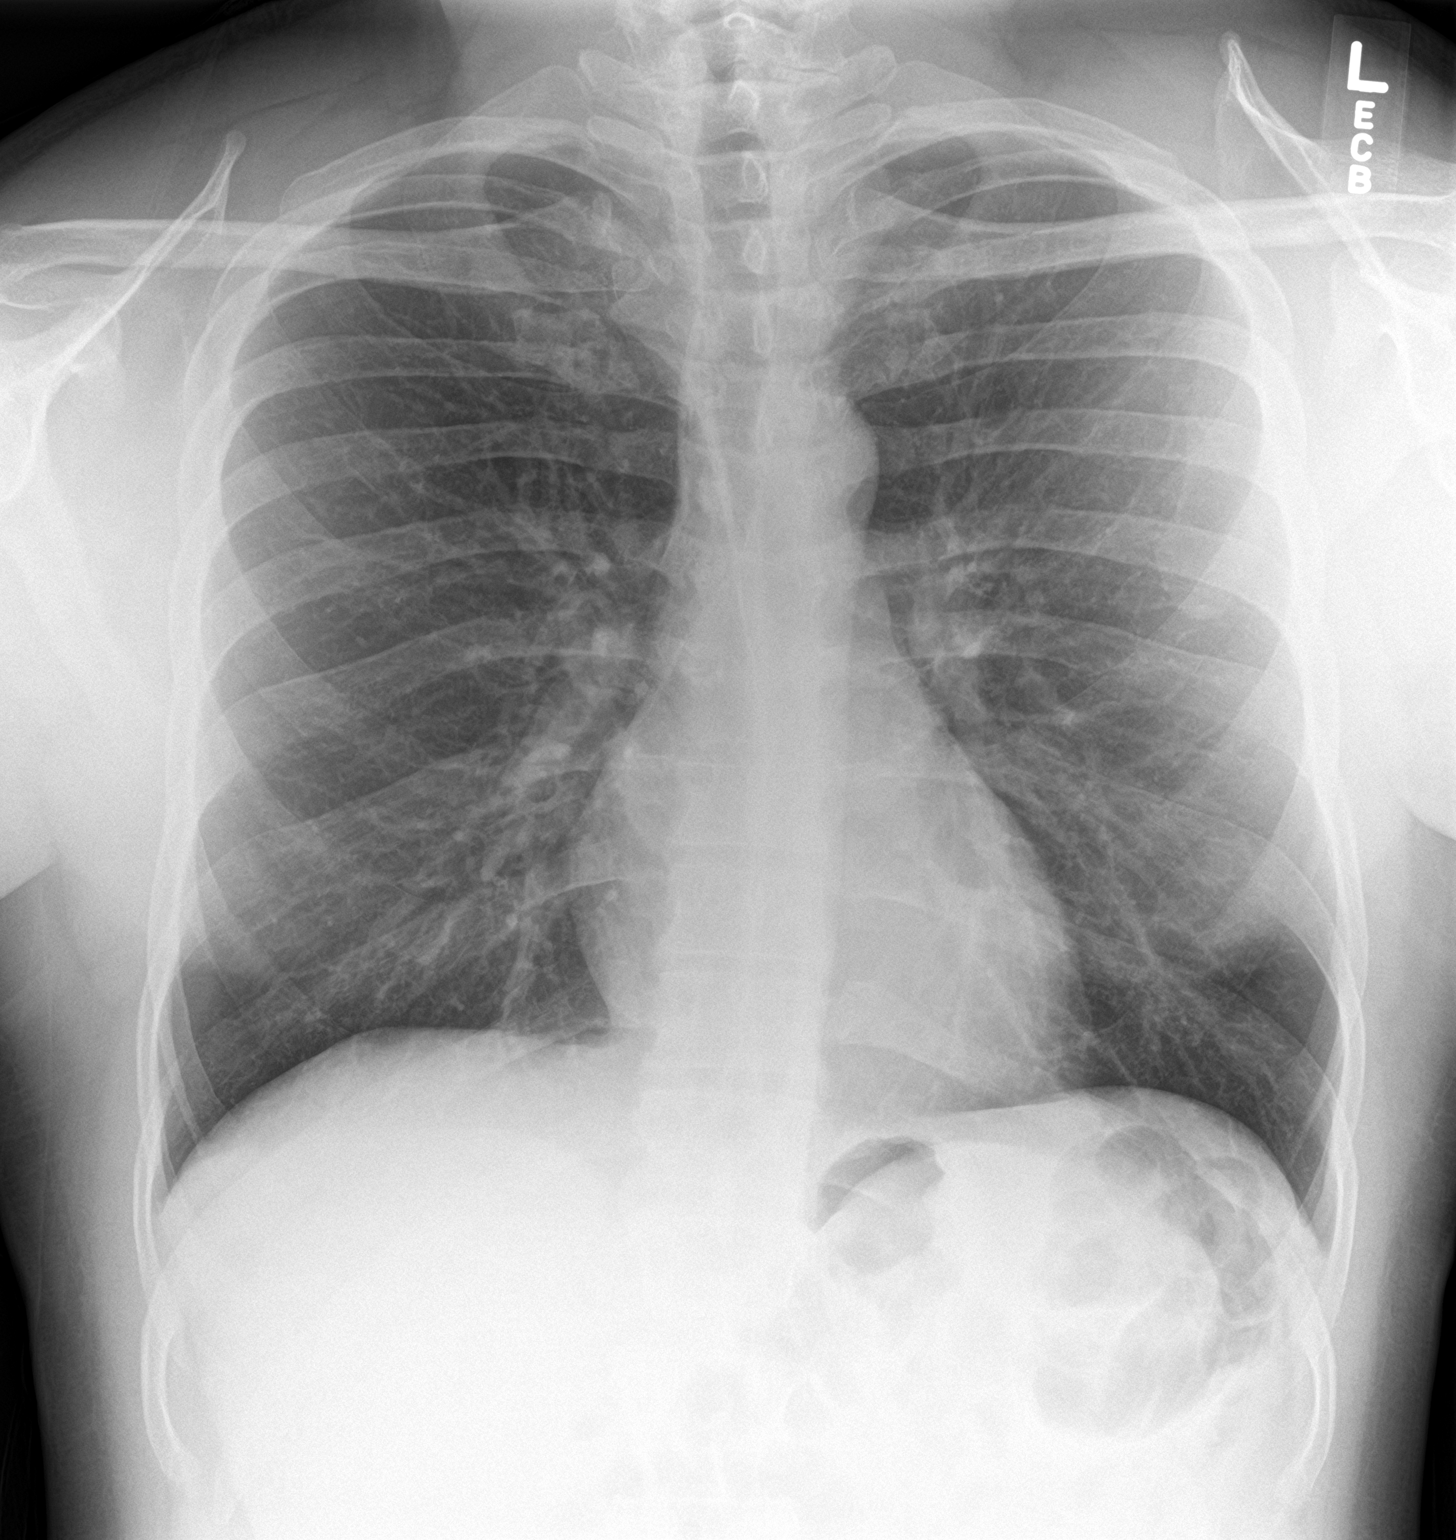

[chest lat]
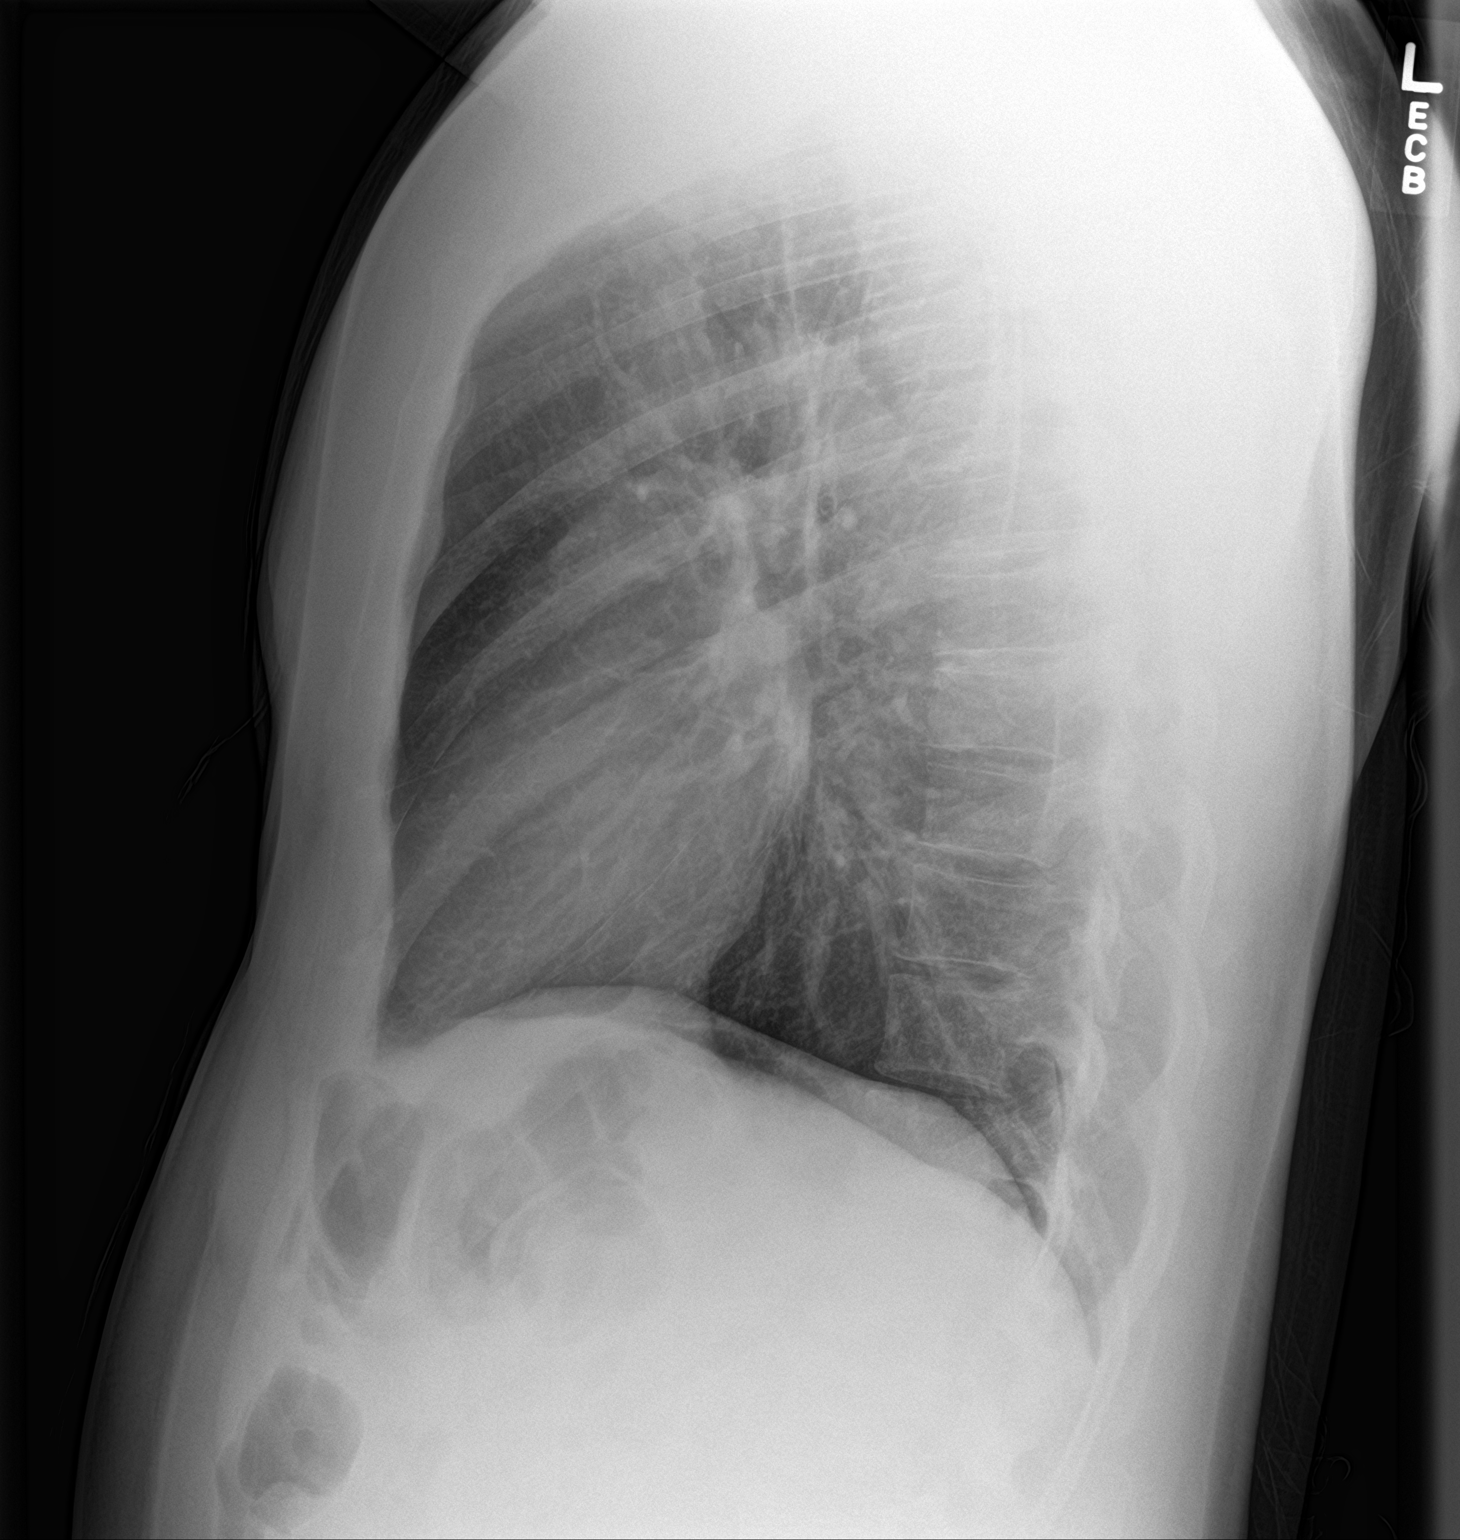

[2 of 2 positions shown; findings below may reference images not displayed]

FINDINGS: The heart size and mediastinal contours are within normal limits.
Both lungs are clear. The visualized skeletal structures are
unremarkable.
IMPRESSION: No active cardiopulmonary disease.

## 2018-05-03 ENCOUNTER — Ambulatory Visit: Payer: BLUE CROSS/BLUE SHIELD | Admitting: Family Medicine

## 2018-05-04 ENCOUNTER — Ambulatory Visit: Payer: BLUE CROSS/BLUE SHIELD | Admitting: Family Medicine

## 2018-05-13 ENCOUNTER — Encounter: Payer: Self-pay | Admitting: Family Medicine

## 2018-05-13 ENCOUNTER — Ambulatory Visit (INDEPENDENT_AMBULATORY_CARE_PROVIDER_SITE_OTHER): Payer: BLUE CROSS/BLUE SHIELD | Admitting: Family Medicine

## 2018-05-13 ENCOUNTER — Other Ambulatory Visit: Payer: Self-pay

## 2018-05-13 VITALS — BP 132/70 | HR 82 | Temp 97.9°F | Ht 72.0 in | Wt 214.6 lb

## 2018-05-13 DIAGNOSIS — E785 Hyperlipidemia, unspecified: Secondary | ICD-10-CM | POA: Diagnosis not present

## 2018-05-13 DIAGNOSIS — I10 Essential (primary) hypertension: Secondary | ICD-10-CM | POA: Diagnosis not present

## 2018-05-13 DIAGNOSIS — J45909 Unspecified asthma, uncomplicated: Secondary | ICD-10-CM | POA: Diagnosis not present

## 2018-05-13 MED ORDER — ALBUTEROL SULFATE HFA 108 (90 BASE) MCG/ACT IN AERS: 2.0000 | INHALATION_SPRAY | RESPIRATORY_TRACT | 0 refills | Status: DC | PRN

## 2018-05-13 MED ORDER — AMLODIPINE BESYLATE 5 MG PO TABS: ORAL_TABLET | ORAL | 1 refills | Status: DC

## 2018-05-13 MED ORDER — BUDESONIDE-FORMOTEROL FUMARATE 80-4.5 MCG/ACT IN AERO: 2.0000 | INHALATION_SPRAY | Freq: Two times a day (BID) | RESPIRATORY_TRACT | 5 refills | Status: DC

## 2018-05-13 NOTE — Patient Instructions (Addendum)
Continue amlodpine, check blood pressure once per day for now. See technique below to make sure you get accurate readings, and if remaining over 140/90 - take 10 mg every day.   Tylenol if needed may have less risks and effect on your blood pressure. If needed, take Alleve intermittently only. Please follow up if knee pain or back pain persists.   Return for lab only visit next week. Recheck in 6 months.   If you have lab work done today you will be contacted with your lab results within the next 2 weeks.  If you have not heard from [place] then please contact us. The fastest way to get your results is to register for My Chart.   How to Take Your Blood Pressure You can take your blood pressure at home with a machine. You may need to check your blood pressure at home:  To check if you have high blood pressure (hypertension).  To check your blood pressure over time.  To make sure your blood pressure medicine is working.  Supplies needed: You will need a blood pressure machine, or monitor. You can buy one at a drugstore or online. When choosing one:  Choose one with an arm cuff.  Choose one that wraps around your upper arm. Only one finger should fit between your arm and the cuff.  Do not choose one that measures your blood pressure from your wrist or finger.  Your doctor can suggest a monitor. How to prepare Avoid these things for 30 minutes before checking your blood pressure:  Drinking caffeine.  Drinking alcohol.  Eating.  Smoking.  Exercising.  Five minutes before checking your blood pressure:  Pee.  Sit in a dining chair. Avoid sitting in a soft couch or armchair.  Be quiet. Do not talk.  How to take your blood pressure Follow the instructions that came with your machine. If you have a digital blood pressure monitor, these may be the instructions: 1. Sit up straight. 2. Place your feet on the floor. Do not cross your ankles or legs. 3. Rest your left arm at the  level of your heart. You may rest it on a table, desk, or chair. 4. Pull up your shirt sleeve. 5. Wrap the blood pressure cuff around the upper part of your left arm. The cuff should be 1 inch (2.5 cm) above your elbow. It is best to wrap the cuff around bare skin. 6. Fit the cuff snugly around your arm. You should be able to place only one finger between the cuff and your arm. 7. Put the cord inside the groove of your elbow. 8. Press the power button. 9. Sit quietly while the cuff fills with air and loses air. 10. Write down the numbers on the screen. 11. Wait 2-3 minutes and then repeat steps 1-10.  What do the numbers mean? Two numbers make up your blood pressure. The first number is called systolic pressure. The second is called diastolic pressure. An example of a blood pressure reading is "120 over 80" (or 120/80). If you are an adult and do not have a medical condition, use this guide to find out if your blood pressure is normal: Normal  First number: below 120.  Second number: below 80. Elevated  First number: 120-129.  Second number: below 80. Hypertension stage 1  First number: 130-139.  Second number: 80-89. Hypertension stage 2  First number: 140 or above.  Second number: 90 or above. Your blood pressure is above normal  even if only the top or bottom number is above normal. Follow these instructions at home:  Check your blood pressure as often as your doctor tells you to.  Take your monitor to your next doctor's appointment. Your doctor will: ? Make sure you are using it correctly. ? Make sure it is working right.  Make sure you understand what your blood pressure numbers should be.  Tell your doctor if your medicines are causing side effects. Contact a doctor if:  Your blood pressure keeps being high. Get help right away if:  Your first blood pressure number is higher than 180.  Your second blood pressure number is higher than 120. This information is  not intended to replace advice given to you by your health care provider. Make sure you discuss any questions you have with your health care provider. Document Released: 08/14/2008 Document Revised: 07/30/2016 Document Reviewed: 02/08/2016 Elsevier Interactive Patient Education  2018 [employer]   IF you received an x-ray today, you will receive an invoice from [facility]. Please contact [facility] at [phone] with questions or concerns regarding your invoice.   IF you received labwork today, you will receive an invoice from [facility]. Please contact [facility] at [phone] with questions or concerns regarding your invoice.   Our billing staff will not be able to assist you with questions regarding bills from these companies.  You will be contacted with the lab results as soon as they are available. The fastest way to get your results is to activate your My Chart account. Instructions are located on the last page of this paperwork. If you have not heard from us regarding the results in 2 weeks, please contact this office.
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
## 2018-05-19 ENCOUNTER — Ambulatory Visit (INDEPENDENT_AMBULATORY_CARE_PROVIDER_SITE_OTHER): Payer: BLUE CROSS/BLUE SHIELD | Admitting: Family Medicine

## 2018-05-19 DIAGNOSIS — I10 Essential (primary) hypertension: Secondary | ICD-10-CM

## 2018-05-19 DIAGNOSIS — E785 Hyperlipidemia, unspecified: Secondary | ICD-10-CM

## 2018-05-19 LAB — COMPREHENSIVE METABOLIC PANEL
A/G RATIO: 1.4 (ref 1.2–2.2)
ALT: 23 IU/L (ref 0–44)
AST: 20 IU/L (ref 0–40)
Albumin: 4.5 g/dL (ref 3.5–5.5)
Alkaline Phosphatase: 39 IU/L (ref 39–117)
BILIRUBIN TOTAL: 0.6 mg/dL (ref 0.0–1.2)
BUN/Creatinine Ratio: 9 (ref 9–20)
BUN: 9 mg/dL (ref 6–20)
CHLORIDE: 99 mmol/L (ref 96–106)
CO2: 25 mmol/L (ref 20–29)
Calcium: 9.5 mg/dL (ref 8.7–10.2)
Creatinine, Ser: 0.99 mg/dL (ref 0.76–1.27)
GFR calc Af Amer: 114 mL/min/{1.73_m2} (ref >59)
GFR calc non Af Amer: 98 mL/min/{1.73_m2} (ref >59)
GLUCOSE: 92 mg/dL (ref 65–99)
Globulin, Total: 3.3 g/dL (ref 1.5–4.5)
POTASSIUM: 3.6 mmol/L (ref 3.5–5.2)
Sodium: 139 mmol/L (ref 134–144)
Total Protein: 7.8 g/dL (ref 6.0–8.5)

## 2018-05-19 LAB — LIPID PANEL
CHOL/HDL RATIO: 4.5 ratio (ref 0.0–5.0)
Cholesterol, Total: 179 mg/dL (ref 100–199)
HDL: 40 mg/dL (ref >39)
LDL CALC: 105 mg/dL — AB (ref 0–99)
Triglycerides: 171 mg/dL — ABNORMAL HIGH (ref 0–149)
VLDL CHOLESTEROL CAL: 34 mg/dL (ref 5–40)

## 2018-05-29 ENCOUNTER — Ambulatory Visit: Payer: BLUE CROSS/BLUE SHIELD | Admitting: Family Medicine

## 2018-06-06 ENCOUNTER — Other Ambulatory Visit: Payer: Self-pay | Admitting: Family Medicine

## 2018-06-06 DIAGNOSIS — J45909 Unspecified asthma, uncomplicated: Secondary | ICD-10-CM

## 2018-09-21 ENCOUNTER — Other Ambulatory Visit: Payer: Self-pay | Admitting: Family Medicine

## 2018-09-21 DIAGNOSIS — I10 Essential (primary) hypertension: Secondary | ICD-10-CM

## 2018-09-24 ENCOUNTER — Other Ambulatory Visit: Payer: Self-pay | Admitting: Family Medicine

## 2018-09-24 DIAGNOSIS — I10 Essential (primary) hypertension: Secondary | ICD-10-CM

## 2018-09-24 NOTE — Telephone Encounter (Signed)
Requested Prescriptions  Pending Prescriptions Disp Refills  . amLODipine (NORVASC) 5 MG tablet [Pharmacy Med Name: AMLODIPINE BESYLATE 5 MG TAB] 90 tablet 1    Sig: TAKE 1 TABLET BY MOUTH EVERY DAY (IF PRESSURE REMAINS GREATER THAN 140 INCREASE TO 2 TABLETS)     Cardiovascular:  Calcium Channel Blockers Passed - 09/24/2018  1:49 AM      Passed - Last BP in normal range    BP Readings from Last 1 Encounters:  05/13/18 132/70         Passed - Valid encounter within last 6 months    Recent Outpatient Visits          4 months ago Essential hypertension   Primary Care at [facility], Sandria Bales, MD   4 months ago Essential hypertension   Primary Care at [facility], Asencion Partridge, MD   10 months ago Essential hypertension   Primary Care at [facility], Asencion Partridge, MD   1 year ago Essential hypertension   Primary Care at [facility], Sandria Bales, MD   2 years ago Left wrist pain   Primary Care at [facility], Asencion Partridge, MD

## 2018-11-06 ENCOUNTER — Other Ambulatory Visit: Payer: Self-pay | Admitting: Family Medicine

## 2018-11-06 DIAGNOSIS — J45909 Unspecified asthma, uncomplicated: Secondary | ICD-10-CM

## 2018-11-08 NOTE — Telephone Encounter (Signed)
Requested Prescriptions  Pending Prescriptions Disp Refills  . SYMBICORT 80-4.5 MCG/ACT inhaler [Pharmacy Med Name: SYMBICORT 80-4.5 MCG INHALER] 30.6 Inhaler 1    Sig: TAKE 2 PUFFS BY MOUTH TWICE A DAY     Pulmonology:  Combination Products Passed - 11/06/2018 12:51 AM      Passed - Valid encounter within last 12 months    Recent Outpatient Visits          5 months ago Essential hypertension   Primary Care at [facility], Sandria Bales, MD   5 months ago Essential hypertension   Primary Care at [facility], Asencion Partridge, MD   12 months ago Essential hypertension   Primary Care at [facility], Asencion Partridge, MD   1 year ago Essential hypertension   Primary Care at [facility], Sandria Bales, MD   3 years ago Left wrist pain   Primary Care at [facility], Asencion Partridge, MD

## 2018-11-24 ENCOUNTER — Encounter (HOSPITAL_COMMUNITY): Payer: Self-pay | Admitting: Emergency Medicine

## 2018-11-24 ENCOUNTER — Ambulatory Visit (HOSPITAL_COMMUNITY)
Admission: EM | Admit: 2018-11-24 | Discharge: 2018-11-24 | Disposition: A | Discharge status: Home / Self Care | Payer: BLUE CROSS/BLUE SHIELD | Attending: Family Medicine | Admitting: Family Medicine

## 2018-11-24 DIAGNOSIS — K029 Dental caries, unspecified: Secondary | ICD-10-CM | POA: Diagnosis not present

## 2018-11-24 DIAGNOSIS — M545 Low back pain, unspecified: Secondary | ICD-10-CM

## 2018-11-24 MED ORDER — PREDNISONE 50 MG PO TABS: ORAL_TABLET | ORAL | 0 refills | Status: DC

## 2018-11-24 MED ORDER — PENICILLIN V POTASSIUM 500 MG PO TABS: 500.0000 mg | ORAL_TABLET | Freq: Three times a day (TID) | ORAL | 0 refills | Status: DC

## 2018-11-24 MED ORDER — DICLOFENAC SODIUM 75 MG PO TBEC: 75.0000 mg | DELAYED_RELEASE_TABLET | Freq: Two times a day (BID) | ORAL | 0 refills | Status: DC

## 2018-11-24 NOTE — ED Triage Notes (Signed)
Pt states hes supposed to have his wisdom teeth out and needs a prescription to "get the swelling down". Pt also c/o lower back pain x3 days now.

## 2018-11-24 NOTE — ED Provider Notes (Signed)
[facility]    CSN: [id number] Arrival date & time: 11/24/18  1156     History   Chief Complaint Chief Complaint  Patient presents with  . Back Pain  . Dental Pain    HPI Greg Stokes is a 38 y.o. male.   Pt states hes supposed to have his wisdom teeth out and needs a prescription to "get the swelling down". Pt also c/o lower back pain x3 days now.   Patient is planning on major dental work in next two weeks.    No h/o back pain, fever, urinary problems, leg pain or difficulty with urination.  Initial [facility] visit     Past Medical History:  Diagnosis Date  . Asthma    prn inhaler  . Hypertension   . Retained orthopedic hardware 02/2015   left hand    Patient Active Problem List   Diagnosis Date Noted  . Moderate persistent asthma 09/26/2015  . Elevated BP 05/01/2015  . Rhinitis, allergic 04/13/2015    Past Surgical History:  Procedure Laterality Date  . HARDWARE REMOVAL Left 02/19/2015   Procedure: REMOVAL PLATE/SCREWS WITH CAPSULECTOMY;  Surgeon: Dairl Ponder, MD;  Location: [facility];  Service: Orthopedics;  Laterality: Left;  . OPEN REDUCTION INTERNAL FIXATION (ORIF) METACARPAL Left 07/28/2014   Procedure: OPEN REDUCTION INTERNAL FIXATION (ORIF) LEFT SMALL METACARPAL FRACTURE;  Surgeon: Dairl Ponder, MD;  Location: [facility];  Service: Orthopedics;  Laterality: Left;  . TENOLYSIS Left 02/19/2015   Procedure: TENOLYSIS LEFT SMALL FINGER METACARPAL ;  Surgeon: Dairl Ponder, MD;  Location: [facility];  Service: Orthopedics;  Laterality: Left;       Home Medications    Prior to Admission medications   Medication Sig Start Date End Date Taking? Authorizing Provider  amLODipine (NORVASC) 5 MG tablet TAKE 1 TABLET BY MOUTH EVERY DAY (IF PRESSURE REMAINS GREATER THAN 140 INCREASE TO 2 TABLETS) 09/24/18   Shade Flood, MD  diclofenac (VOLTAREN) 75 MG EC tablet Take 1 tablet (75 mg total) by  mouth 2 (two) times daily. 11/24/18   Elvina Sidle, MD  Multiple Vitamin (MULTIVITAMIN WITH MINERALS) TABS tablet Take 1 tablet by mouth daily.    [provider]  penicillin v potassium (VEETID) 500 MG tablet Take 1 tablet (500 mg total) by mouth 3 (three) times daily. 11/24/18   Elvina Sidle, MD  predniSONE (DELTASONE) 50 MG tablet One daily with food 11/24/18   Elvina Sidle, MD  PROAIR HFA 108 [phone] Base) MCG/ACT inhaler INHALE 2 PUFFS INTO THE LUNGS EVERY 4 (FOUR) HOURS AS NEEDED FOR WHEEZING OR SHORTNESS OF BREATH 06/07/18   Shade Flood, MD  SYMBICORT 80-4.5 MCG/ACT inhaler TAKE 2 PUFFS BY MOUTH TWICE A DAY 11/08/18   Shade Flood, MD    Family History Family History  Problem Relation Age of Onset  . Cancer Mother   . Hyperlipidemia Father     Social History Social History   Tobacco Use  . Smoking status: Never Smoker  . Smokeless tobacco: Never Used  Substance Use Topics  . Alcohol use: Yes    Comment: 2/week  . Drug use: No     Allergies   Lisinopril and Soap   Review of Systems Review of Systems   Physical Exam Triage Vital Signs ED Triage Vitals  Enc Vitals Group     BP 11/24/18 1216 (!) 164/103     Pulse Rate 11/24/18 1215 88     Resp 11/24/18  1215 16     Temp 11/24/18 1215 97.7 F (36.5 C)     Temp src --      SpO2 11/24/18 1215 100 %     Weight --      Height --      Head Circumference --      Peak Flow --      Pain Score 11/24/18 1216 8     Pain Loc --      Pain Edu? --      Excl. in GC? --    No data found.  Updated Vital Signs BP (!) 164/103   Pulse 87   Temp 97.8 F (36.6 C)   Resp 16   SpO2 99%    Physical Exam Vitals signs and nursing note reviewed.  Constitutional:      Appearance: Normal appearance.  HENT:     Head: Normocephalic and atraumatic.     Mouth/Throat:     Mouth: Mucous membranes are moist.     Comments: Multiple eroded teeth Eyes:     Conjunctiva/sclera: Conjunctivae normal.  Neck:      Musculoskeletal: Normal range of motion and neck supple. No muscular tenderness.  Cardiovascular:     Rate and Rhythm: Normal rate.  Pulmonary:     Effort: Pulmonary effort is normal.  Musculoskeletal:        General: Tenderness present. No deformity or signs of injury.  Lymphadenopathy:     Cervical: No cervical adenopathy.  Skin:    General: Skin is warm and dry.  Neurological:     General: No focal deficit present.     Mental Status: He is alert and oriented to person, place, and time.  Psychiatric:        Mood and Affect: Mood normal.        Behavior: Behavior normal.      UC Treatments / Results  Labs (all labs ordered are listed, but only abnormal results are displayed) Labs Reviewed - No data to display  EKG None  Radiology No results found.  Procedures Procedures (including critical care time)  Medications Ordered in UC Medications - No data to display  Initial Impression / Assessment and Plan / UC Course  I have reviewed the triage vital signs and the nursing notes.  Pertinent labs & imaging results that were available during my care of the patient were reviewed by me and considered in my medical decision making (see chart for details).    Final Clinical Impressions(s) / UC Diagnoses   Final diagnoses:  Pain due to dental caries  Acute bilateral low back pain without sciatica   Discharge Instructions   None    ED Prescriptions    Medication Sig Dispense Auth. Provider   penicillin v potassium (VEETID) 500 MG tablet Take 1 tablet (500 mg total) by mouth 3 (three) times daily. 30 tablet Elvina Sidle, MD   predniSONE (DELTASONE) 50 MG tablet One daily with food 5 tablet Elvina Sidle, MD   diclofenac (VOLTAREN) 75 MG EC tablet Take 1 tablet (75 mg total) by mouth 2 (two) times daily. 14 tablet Elvina Sidle, MD     Controlled Substance Prescriptions [place] Controlled Substance Registry consulted? Not Applicable   Elvina Sidle, MD  11/24/18 1242

## 2018-11-28 ENCOUNTER — Other Ambulatory Visit: Payer: Self-pay | Admitting: Family Medicine

## 2018-11-28 DIAGNOSIS — J45909 Unspecified asthma, uncomplicated: Secondary | ICD-10-CM

## 2018-11-29 NOTE — Telephone Encounter (Signed)
Patient called, recording the person is not accepting calls at this time, call back later. Patient will need an OV for refills of medications.

## 2018-12-11 ENCOUNTER — Other Ambulatory Visit: Payer: Self-pay | Admitting: Family Medicine

## 2018-12-11 DIAGNOSIS — I10 Essential (primary) hypertension: Secondary | ICD-10-CM

## 2019-01-20 ENCOUNTER — Other Ambulatory Visit: Payer: Self-pay | Admitting: Family Medicine

## 2019-01-20 DIAGNOSIS — I10 Essential (primary) hypertension: Secondary | ICD-10-CM

## 2019-01-20 NOTE — Telephone Encounter (Signed)
Requested medication (s) are due for refill today:  Yes   Requested medication (s) are on the active medication list:  yes  Future visit scheduled:  no  Last Refill: 09/24/18; #90; no refill  Note to clinic- attempted to call pt., via [employer] [employer]; left vm to call and schedule an appt. With Dr. Neva Seat  Requested Prescriptions  Pending Prescriptions Disp Refills   amLODipine (NORVASC) 5 MG tablet [Pharmacy Med Name: AMLODIPINE BESYLATE 5 MG TAB] 90 tablet 0    Sig: TAKE 1 TABLET BY MOUTH EVERY DAY (IF PRESSURE REMAINS GREATER THAN 140 INCREASE TO 2 TABLETS)     Cardiovascular:  Calcium Channel Blockers Failed - 01/20/2019 10:40 AM      Failed - Last BP in normal range    BP Readings from Last 1 Encounters:  11/24/18 (!) 164/103         Failed - Valid encounter within last 6 months    Recent Outpatient Visits          8 months ago Essential hypertension   Primary Care at [facility], Sandria Bales, MD   8 months ago Essential hypertension   Primary [facility] at [facility], Asencion Partridge, MD   1 year ago Essential hypertension   [facility] at [facility], Asencion Partridge, MD   1 year ago Essential hypertension   Primary Care at [facility], Sandria Bales, MD   3 years ago Left wrist pain   Primary Care at [facility], Asencion Partridge, MD

## 2019-01-31 ENCOUNTER — Other Ambulatory Visit: Payer: Self-pay | Admitting: Family Medicine

## 2019-01-31 DIAGNOSIS — J45909 Unspecified asthma, uncomplicated: Secondary | ICD-10-CM

## 2019-01-31 DIAGNOSIS — I10 Essential (primary) hypertension: Secondary | ICD-10-CM

## 2019-01-31 NOTE — Telephone Encounter (Signed)
Patient called, recording says the person you are calling can not accept calls at this time. Patient will need a virtual visit before any more refills as noted last refill 01/20/19.

## 2019-03-05 ENCOUNTER — Other Ambulatory Visit: Payer: Self-pay | Admitting: Family Medicine

## 2019-03-05 DIAGNOSIS — I10 Essential (primary) hypertension: Secondary | ICD-10-CM

## 2019-03-07 NOTE — Telephone Encounter (Signed)
No answer

## 2019-03-18 ENCOUNTER — Other Ambulatory Visit: Payer: Self-pay | Admitting: Family Medicine

## 2019-03-18 DIAGNOSIS — I10 Essential (primary) hypertension: Secondary | ICD-10-CM

## 2019-03-23 ENCOUNTER — Encounter (HOSPITAL_COMMUNITY): Payer: Self-pay | Admitting: Emergency Medicine

## 2019-03-23 ENCOUNTER — Ambulatory Visit (HOSPITAL_COMMUNITY)
Admission: EM | Admit: 2019-03-23 | Discharge: 2019-03-23 | Disposition: A | Discharge status: Home / Self Care | Payer: BLUE CROSS/BLUE SHIELD | Attending: Emergency Medicine | Admitting: Emergency Medicine

## 2019-03-23 DIAGNOSIS — I10 Essential (primary) hypertension: Secondary | ICD-10-CM | POA: Diagnosis not present

## 2019-03-23 DIAGNOSIS — M62830 Muscle spasm of back: Secondary | ICD-10-CM | POA: Diagnosis not present

## 2019-03-23 MED ORDER — AMLODIPINE BESYLATE 5 MG PO TABS: 5.0000 mg | ORAL_TABLET | Freq: Every day | ORAL | 0 refills | Status: DC

## 2019-03-23 MED ORDER — METHOCARBAMOL 500 MG PO TABS: 500.0000 mg | ORAL_TABLET | Freq: Two times a day (BID) | ORAL | 0 refills | Status: DC

## 2019-03-23 MED ORDER — IBUPROFEN 600 MG PO TABS: 600.0000 mg | ORAL_TABLET | Freq: Four times a day (QID) | ORAL | 0 refills | Status: DC | PRN

## 2019-03-23 NOTE — Discharge Instructions (Addendum)
Your blood pressure was elevated today at 166/99.  Have this rechecked in 2 to 3 weeks by your primary care provider.  Follow a low-sodium diet.  Take your blood pressure medication amlodipine as prescribed.    Take the prescribed ibuprofen and muscle relaxer for your back pain.  The muscle relaxer may cause you to be sleepy so do not drive, operate machinery, or drink alcohol while taking this.    Follow up in the emergency department or return here if you develop numbness, tingling, weakness, rash, abdominal pain, difficulty with urination, shortness of breath, chest pain, palpitations, dizziness, or other concerning symptoms.

## 2019-03-23 NOTE — ED Triage Notes (Signed)
Pt c/o center lower back pain x7weeks, states for the last few days his L shoulder has been hurting. Pain with movement, bending over.

## 2019-03-23 NOTE — ED Provider Notes (Signed)
[place]    CSN: [id number] Arrival date & time: 03/23/19  1347      History   Chief Complaint Chief Complaint  Patient presents with  . Back Pain    HPI Greg Stokes is a 38 y.o. male.   Patient presents today with lower back pain x several weeks and left shoulder pain x 3-4 days.  He states the pain is worse with movement and palpation of the muscles.  He denies fall or injury.  He denies paresthesias, weakness, rash, bruising, abdominal pain, dysuria, shortness of breath, chest pain, palpitations, dizziness, or other symptoms.  Patient states he has been out of his blood pressure medication for four days and is scheduled to be seen by his primary care provider on 04/07/2019 for refill of prescription for amlodipine.    The history is provided by the patient.    Past Medical History:  Diagnosis Date  . Asthma    prn inhaler  . Hypertension   . Retained orthopedic hardware 02/2015   left hand    Patient Active Problem List   Diagnosis Date Noted  . Moderate persistent asthma 09/26/2015  . Elevated BP 05/01/2015  . Rhinitis, allergic 04/13/2015    Past Surgical History:  Procedure Laterality Date  . HARDWARE REMOVAL Left 02/19/2015   Procedure: REMOVAL PLATE/SCREWS WITH CAPSULECTOMY;  Surgeon: Charlotte Crumb, MD;  Location: [place];  Service: Orthopedics;  Laterality: Left;  . OPEN REDUCTION INTERNAL FIXATION (ORIF) METACARPAL Left 07/28/2014   Procedure: OPEN REDUCTION INTERNAL FIXATION (ORIF) LEFT SMALL METACARPAL FRACTURE;  Surgeon: Charlotte Crumb, MD;  Location: [place];  Service: Orthopedics;  Laterality: Left;  . TENOLYSIS Left 02/19/2015   Procedure: TENOLYSIS LEFT SMALL FINGER METACARPAL ;  Surgeon: Charlotte Crumb, MD;  Location: [place];  Service: Orthopedics;  Laterality: Left;       Home Medications    Prior to Admission medications   Medication Sig Start Date End Date Taking?  Authorizing Provider  SYMBICORT 80-4.5 MCG/ACT inhaler TAKE 2 PUFFS BY MOUTH TWICE A DAY 11/08/18  Yes Wendie Agreste, MD  albuterol [facility] HFA) 108 (90 Base) MCG/ACT inhaler Inhale 2 puffs into the lungs every 4 (four) hours as needed for wheezing or shortness of breath. OFFICE VISIT NEEDED FOR ADDITIONAL REFILLS. 11/29/18   Wendie Agreste, MD  amLODipine (NORVASC) 5 MG tablet TAKE 1 TABLET BY MOUTH EVERY DAY (IF PRESSURE REMAINS GREATER THAN 140 INCREASE TO 2 TABLETS) 01/20/19   Wendie Agreste, MD  diclofenac (VOLTAREN) 75 MG EC tablet Take 1 tablet (75 mg total) by mouth 2 (two) times daily. 11/24/18   Robyn Haber, MD  Multiple Vitamin (MULTIVITAMIN WITH MINERALS) TABS tablet Take 1 tablet by mouth daily.    [provider]  penicillin v potassium (VEETID) 500 MG tablet Take 1 tablet (500 mg total) by mouth 3 (three) times daily. 11/24/18   Robyn Haber, MD  predniSONE (DELTASONE) 50 MG tablet One daily with food 11/24/18   Robyn Haber, MD    Family History Family History  Problem Relation Age of Onset  . Cancer Mother   . Hyperlipidemia Father     Social History Social History   Tobacco Use  . Smoking status: Never Smoker  . Smokeless tobacco: Never Used  Substance Use Topics  . Alcohol use: Yes    Comment: 2/week  . Drug use: No     Allergies   Lisinopril and Soap   Review  of Systems Review of Systems  Constitutional: Negative for chills and fever.  HENT: Negative for ear pain and sore throat.   Eyes: Negative for pain and visual disturbance.  Respiratory: Negative for cough and shortness of breath.   Cardiovascular: Negative for chest pain and palpitations.  Gastrointestinal: Negative for abdominal pain and vomiting.  Genitourinary: Negative for dysuria and hematuria.  Musculoskeletal: Positive for back pain. Negative for arthralgias.  Skin: Negative for color change and rash.  Neurological: Negative for dizziness, seizures, syncope,  weakness, numbness and headaches.  All other systems reviewed and are negative.    Physical Exam Triage Vital Signs ED Triage Vitals [03/23/19 1414]  Enc Vitals Group     BP (!) 166/99     Pulse Rate 90     Resp 18     Temp 97.6 F (36.4 C)     Temp src      SpO2 97 %     Weight      Height      Head Circumference      Peak Flow      Pain Score 8     Pain Loc      Pain Edu?      Excl. in GC?    No data found.  Updated Vital Signs BP (!) 166/99   Pulse 90   Temp 97.6 F (36.4 C)   Resp 18   SpO2 97%   Visual Acuity Right Eye Distance:   Left Eye Distance:   Bilateral Distance:    Right Eye Near:   Left Eye Near:    Bilateral Near:     Physical Exam Vitals signs and nursing note reviewed.  Constitutional:      Appearance: He is well-developed.  HENT:     Head: Normocephalic and atraumatic.  Eyes:     Conjunctiva/sclera: Conjunctivae normal.  Neck:     Musculoskeletal: Neck supple.  Cardiovascular:     Rate and Rhythm: Normal rate and regular rhythm.     Heart sounds: No murmur.  Pulmonary:     Effort: Pulmonary effort is normal. No respiratory distress.     Breath sounds: Normal breath sounds.  Abdominal:     Palpations: Abdomen is soft.     Tenderness: There is no abdominal tenderness.  Musculoskeletal:        General: Tenderness present. No deformity or signs of injury.     Comments: Back and left shoulder: Muscle tenderness to palpation.  No bony tenderness. FROM.  2+ pulses sensation intact, strength 5/5.  Skin:    General: Skin is warm and dry.  Neurological:     General: No focal deficit present.     Mental Status: He is alert and oriented to person, place, and time.     Sensory: No sensory deficit.     Motor: No weakness.     Coordination: Coordination normal.     Gait: Gait normal.     Deep Tendon Reflexes: Reflexes normal.      UC Treatments / Results  Labs (all labs ordered are listed, but only abnormal results are displayed)  Labs Reviewed - No data to display  EKG   Radiology No results found.  Procedures Procedures (including critical care time)  Medications Ordered in UC Medications - No data to display  Initial Impression / Assessment and Plan / UC Course  I have reviewed the triage vital signs and the nursing notes.  Pertinent labs & imaging results that were available  during my care of the patient were reviewed by me and considered in my medical decision making (see chart for details).   Muscle spasm of back.  Elevated blood pressure in known hypertensive.  Treating today with ibuprofen and Robaxin.  Discussed elevated blood pressure with patient; provided amlodipine refill x2 weeks until he is able to see his primary care provider as scheduled.  Discussed that he should return here or follow-up in the emergency department if he develops numbness, tingling, weakness, rash, abdominal pain, dysuria, shortness of breath, chest pain, palpitations, dizziness, other concerning symptoms.     Final Clinical Impressions(s) / UC Diagnoses   Final diagnoses:  None   Discharge Instructions   None    ED Prescriptions    None     Controlled Substance Prescriptions [place] Controlled Substance Registry consulted? Yes, I have consulted the [place] Controlled Substances Registry for this patient, and feel the risk/benefit ratio today is favorable for proceeding with this prescription for a controlled substance.   Mickie Bailate, Mansoor Hillyard H, NP 03/23/19 [phone]

## 2019-03-24 ENCOUNTER — Emergency Department (HOSPITAL_COMMUNITY)
Admission: EM | Admit: 2019-03-24 | Discharge: 2019-03-24 | Disposition: A | Discharge status: Home / Self Care | Payer: BLUE CROSS/BLUE SHIELD | Attending: Emergency Medicine | Admitting: Emergency Medicine

## 2019-03-24 ENCOUNTER — Other Ambulatory Visit: Payer: Self-pay

## 2019-03-24 ENCOUNTER — Encounter (HOSPITAL_COMMUNITY): Payer: Self-pay | Admitting: Emergency Medicine

## 2019-03-24 DIAGNOSIS — I10 Essential (primary) hypertension: Secondary | ICD-10-CM | POA: Insufficient documentation

## 2019-03-24 DIAGNOSIS — Z79899 Other long term (current) drug therapy: Secondary | ICD-10-CM | POA: Insufficient documentation

## 2019-03-24 DIAGNOSIS — J45909 Unspecified asthma, uncomplicated: Secondary | ICD-10-CM | POA: Insufficient documentation

## 2019-03-24 DIAGNOSIS — M546 Pain in thoracic spine: Secondary | ICD-10-CM

## 2019-03-24 NOTE — ED Provider Notes (Signed)
[facility] EMERGENCY DEPARTMENT Provider Note   CSN: [id number] Arrival date & time: 03/24/19  1551    History   Chief Complaint Chief Complaint  Patient presents with  . Back Pain    HPI Greg Stokes is a 38 y.o. male.      HPI    38 year old male presents today with complaints of back pain.  He has several weeks of intermittent back pain at different locations in his back.  Today he notes pain at the left lateral thoracic musculature.  He denies any loss of distal sensation strength or motor function.  No red flags for back pain.  No medications prior to arrival.  No traumas.      Past Medical History:  Diagnosis Date  . Asthma    prn inhaler  . Hypertension   . Retained orthopedic hardware 02/2015   left hand    Patient Active Problem List   Diagnosis Date Noted  . Moderate persistent asthma 09/26/2015  . Elevated BP 05/01/2015  . Rhinitis, allergic 04/13/2015    Past Surgical History:  Procedure Laterality Date  . HARDWARE REMOVAL Left 02/19/2015   Procedure: REMOVAL PLATE/SCREWS WITH CAPSULECTOMY;  Surgeon: Charlotte Crumb, MD;  Location: [place];  Service: Orthopedics;  Laterality: Left;  . OPEN REDUCTION INTERNAL FIXATION (ORIF) METACARPAL Left 07/28/2014   Procedure: OPEN REDUCTION INTERNAL FIXATION (ORIF) LEFT SMALL METACARPAL FRACTURE;  Surgeon: Charlotte Crumb, MD;  Location: [place];  Service: Orthopedics;  Laterality: Left;  . TENOLYSIS Left 02/19/2015   Procedure: TENOLYSIS LEFT SMALL FINGER METACARPAL ;  Surgeon: Charlotte Crumb, MD;  Location: [place];  Service: Orthopedics;  Laterality: Left;        Home Medications    Prior to Admission medications   Medication Sig Start Date End Date Taking? Authorizing Provider  amLODipine (NORVASC) 5 MG tablet TAKE 1 TABLET BY MOUTH EVERY DAY (IF PRESSURE REMAINS GREATER THAN 140 INCREASE TO 2 TABLETS) Patient taking differently:  Take 5 mg by mouth daily.  01/20/19  Yes Wendie Agreste, MD  Ascorbic Acid (VITAMIN C) 100 MG tablet Take 100 mg by mouth daily.   Yes [provider]  Omega-3 Fatty Acids (FISH OIL) 1000 MG CAPS Take 1 capsule by mouth daily.   Yes [provider]  SYMBICORT 80-4.5 MCG/ACT inhaler TAKE 2 PUFFS BY MOUTH TWICE A DAY Patient taking differently: Inhale 2 puffs into the lungs 2 (two) times a day.  11/08/18  Yes Wendie Agreste, MD    Family History Family History  Problem Relation Age of Onset  . Cancer Mother   . Hyperlipidemia Father     Social History Social History   Tobacco Use  . Smoking status: Never Smoker  . Smokeless tobacco: Never Used  Substance Use Topics  . Alcohol use: Yes    Comment: 2/week  . Drug use: No     Allergies   Lisinopril and Soap   Review of Systems Review of Systems  All other systems reviewed and are negative.    Physical Exam Updated Vital Signs BP (!) 158/99 (BP Location: Right Arm)   Pulse 97   Temp 98.3 F (36.8 C) (Oral)   Resp 16   SpO2 98%   Physical Exam Vitals signs and nursing note reviewed.  Constitutional:      Appearance: He is well-developed.  HENT:     Head: Normocephalic and atraumatic.  Eyes:     General: No  scleral icterus.       Right eye: No discharge.        Left eye: No discharge.     Conjunctiva/sclera: Conjunctivae normal.     Pupils: Pupils are equal, round, and reactive to light.  Neck:     Musculoskeletal: Normal range of motion.     Vascular: No JVD.     Trachea: No tracheal deviation.  Pulmonary:     Effort: Pulmonary effort is normal.     Breath sounds: No stridor.  Musculoskeletal:     Comments: No CT or L-spine tenderness palpation, tenderness palpation left lateral thoracic musculature, bilateral upper and lower extremity sensation strength motor function is intact  Neurological:     Mental Status: He is alert and oriented to person, place, and time.     Coordination:  Coordination normal.  Psychiatric:        Behavior: Behavior normal.        Thought Content: Thought content normal.        Judgment: Judgment normal.      ED Treatments / Results  Labs (all labs ordered are listed, but only abnormal results are displayed) Labs Reviewed - No data to display  EKG None  Radiology No results found.  Procedures Procedures (including critical care time)  Medications Ordered in ED Medications - No data to display   Initial Impression / Assessment and Plan / ED Course  I have reviewed the triage vital signs and the nursing notes.  Pertinent labs & imaging results that were available during my care of the patient were reviewed by me and considered in my medical decision making (see chart for details).          Assessment/Plan: Patient is here with uncomplicated back pain.  Symptomatic care instructions given strict return precautions given.  He verbalized understanding and agreement to today's plan.      Final Clinical Impressions(s) / ED Diagnoses   Final diagnoses:  Thoracic back pain, unspecified back pain laterality, unspecified chronicity    ED Discharge Orders    None       Rosalio LoudHedges, Lahari Suttles, PA-C 03/28/19 1920    Tegeler, Canary Brimhristopher J, MD 03/29/19 [phone]

## 2019-03-24 NOTE — ED Notes (Signed)
Patient verbalizes understanding of discharge instructions. Opportunity for questioning and answers were provided. Armband removed by staff, pt discharged from ED.  

## 2019-03-24 NOTE — ED Triage Notes (Signed)
Pt c/o lower back pain x 5 weeks. Denies injury or fall. States pain has started radiated up his spine.

## 2019-03-24 NOTE — Discharge Instructions (Addendum)
Please read attached information. If you experience any new or worsening signs or symptoms please return to the emergency room for evaluation. Please follow-up with your primary care provider or specialist as discussed.  °

## 2019-03-24 NOTE — ED Notes (Signed)
ED Provider at bedside. 

## 2019-04-27 ENCOUNTER — Other Ambulatory Visit: Payer: Self-pay | Admitting: Family Medicine

## 2019-04-27 DIAGNOSIS — I10 Essential (primary) hypertension: Secondary | ICD-10-CM

## 2019-05-05 ENCOUNTER — Encounter: Payer: BLUE CROSS/BLUE SHIELD | Admitting: Family Medicine

## 2020-02-10 ENCOUNTER — Emergency Department (HOSPITAL_BASED_OUTPATIENT_CLINIC_OR_DEPARTMENT_OTHER): Payer: BLUE CROSS/BLUE SHIELD

## 2020-02-10 ENCOUNTER — Encounter (HOSPITAL_BASED_OUTPATIENT_CLINIC_OR_DEPARTMENT_OTHER): Payer: Self-pay | Admitting: *Deleted

## 2020-02-10 ENCOUNTER — Other Ambulatory Visit: Payer: Self-pay

## 2020-02-10 ENCOUNTER — Emergency Department (HOSPITAL_BASED_OUTPATIENT_CLINIC_OR_DEPARTMENT_OTHER)
Admission: EM | Admit: 2020-02-10 | Discharge: 2020-02-10 | Disposition: A | Discharge status: Home / Self Care | Payer: BLUE CROSS/BLUE SHIELD | Attending: Emergency Medicine | Admitting: Emergency Medicine

## 2020-02-10 DIAGNOSIS — M542 Cervicalgia: Secondary | ICD-10-CM | POA: Diagnosis present

## 2020-02-10 DIAGNOSIS — Y999 Unspecified external cause status: Secondary | ICD-10-CM | POA: Insufficient documentation

## 2020-02-10 DIAGNOSIS — Z79899 Other long term (current) drug therapy: Secondary | ICD-10-CM | POA: Diagnosis not present

## 2020-02-10 DIAGNOSIS — J45909 Unspecified asthma, uncomplicated: Secondary | ICD-10-CM | POA: Insufficient documentation

## 2020-02-10 DIAGNOSIS — Y92410 Unspecified street and highway as the place of occurrence of the external cause: Secondary | ICD-10-CM | POA: Diagnosis not present

## 2020-02-10 DIAGNOSIS — M545 Low back pain, unspecified: Secondary | ICD-10-CM

## 2020-02-10 DIAGNOSIS — Y9389 Activity, other specified: Secondary | ICD-10-CM | POA: Insufficient documentation

## 2020-02-10 DIAGNOSIS — I10 Essential (primary) hypertension: Secondary | ICD-10-CM | POA: Insufficient documentation

## 2020-02-10 MED ORDER — METHOCARBAMOL 500 MG PO TABS: 500.0000 mg | ORAL_TABLET | Freq: Two times a day (BID) | ORAL | 0 refills | Status: DC

## 2020-02-10 MED ORDER — IBUPROFEN 600 MG PO TABS: 600.0000 mg | ORAL_TABLET | Freq: Four times a day (QID) | ORAL | 0 refills | Status: DC | PRN

## 2020-02-10 NOTE — Discharge Instructions (Signed)
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
## 2020-06-05 ENCOUNTER — Emergency Department (HOSPITAL_COMMUNITY)
Admission: EM | Admit: 2020-06-05 | Discharge: 2020-06-06 | Disposition: A | Discharge status: Home / Self Care | Payer: BLUE CROSS/BLUE SHIELD | Attending: Emergency Medicine | Admitting: Emergency Medicine

## 2020-06-05 ENCOUNTER — Encounter (HOSPITAL_COMMUNITY): Payer: Self-pay | Admitting: *Deleted

## 2020-06-05 ENCOUNTER — Other Ambulatory Visit: Payer: Self-pay

## 2020-06-05 DIAGNOSIS — M5431 Sciatica, right side: Secondary | ICD-10-CM | POA: Diagnosis not present

## 2020-06-05 DIAGNOSIS — M545 Low back pain: Secondary | ICD-10-CM | POA: Diagnosis present

## 2020-06-05 DIAGNOSIS — Z7951 Long term (current) use of inhaled steroids: Secondary | ICD-10-CM | POA: Diagnosis not present

## 2020-06-05 DIAGNOSIS — J45909 Unspecified asthma, uncomplicated: Secondary | ICD-10-CM | POA: Diagnosis not present

## 2020-06-05 DIAGNOSIS — I10 Essential (primary) hypertension: Secondary | ICD-10-CM | POA: Diagnosis not present

## 2020-06-05 DIAGNOSIS — R202 Paresthesia of skin: Secondary | ICD-10-CM | POA: Diagnosis not present

## 2020-06-05 DIAGNOSIS — Z79899 Other long term (current) drug therapy: Secondary | ICD-10-CM | POA: Diagnosis not present
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
## 2020-06-06 MED ORDER — METHYLPREDNISOLONE 4 MG PO TBPK: ORAL_TABLET | ORAL | 0 refills | Status: DC

## 2020-06-06 MED ORDER — CYCLOBENZAPRINE HCL 10 MG PO TABS
5.0000 mg | ORAL_TABLET | Freq: Once | ORAL | Status: AC
  Administered 2020-06-06: 5 mg via ORAL
  Filled 2020-06-06: qty 1

## 2020-06-06 MED ORDER — CYCLOBENZAPRINE HCL 5 MG PO TABS: 5.0000 mg | ORAL_TABLET | Freq: Two times a day (BID) | ORAL | 0 refills | Status: DC | PRN

## 2020-06-06 MED ORDER — NAPROXEN 250 MG PO TABS
500.0000 mg | ORAL_TABLET | Freq: Once | ORAL | Status: AC
  Administered 2020-06-06: 500 mg via ORAL
  Filled 2020-06-06: qty 2

## 2020-06-06 NOTE — Discharge Instructions (Signed)
You were seen today for back pain.  You likely have some sciatica.  This is inflammation of the nerves that extend from your spinal column into your legs.  Take medications as prescribed.  Follow-up with your primary doctor if not improving as you may need some physical therapy.
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
## 2020-09-06 ENCOUNTER — Emergency Department (HOSPITAL_COMMUNITY): Payer: BLUE CROSS/BLUE SHIELD

## 2020-09-06 ENCOUNTER — Other Ambulatory Visit: Payer: Self-pay

## 2020-09-06 ENCOUNTER — Emergency Department (HOSPITAL_COMMUNITY)
Admission: EM | Admit: 2020-09-06 | Discharge: 2020-09-06 | Disposition: A | Discharge status: Home / Self Care | Payer: BLUE CROSS/BLUE SHIELD | Attending: Emergency Medicine | Admitting: Emergency Medicine

## 2020-09-06 ENCOUNTER — Encounter (HOSPITAL_COMMUNITY): Payer: Self-pay | Admitting: *Deleted

## 2020-09-06 DIAGNOSIS — S39012A Strain of muscle, fascia and tendon of lower back, initial encounter: Secondary | ICD-10-CM | POA: Diagnosis not present

## 2020-09-06 DIAGNOSIS — Z79899 Other long term (current) drug therapy: Secondary | ICD-10-CM | POA: Insufficient documentation

## 2020-09-06 DIAGNOSIS — S161XXA Strain of muscle, fascia and tendon at neck level, initial encounter: Secondary | ICD-10-CM | POA: Insufficient documentation

## 2020-09-06 DIAGNOSIS — S29012A Strain of muscle and tendon of back wall of thorax, initial encounter: Secondary | ICD-10-CM | POA: Diagnosis not present

## 2020-09-06 DIAGNOSIS — I10 Essential (primary) hypertension: Secondary | ICD-10-CM | POA: Insufficient documentation

## 2020-09-06 DIAGNOSIS — Y92410 Unspecified street and highway as the place of occurrence of the external cause: Secondary | ICD-10-CM | POA: Diagnosis not present

## 2020-09-06 DIAGNOSIS — J4540 Moderate persistent asthma, uncomplicated: Secondary | ICD-10-CM | POA: Insufficient documentation

## 2020-09-06 DIAGNOSIS — S199XXA Unspecified injury of neck, initial encounter: Secondary | ICD-10-CM | POA: Diagnosis present

## 2020-09-06 MED ORDER — HYDROCODONE-ACETAMINOPHEN 5-325 MG PO TABS
1.0000 | ORAL_TABLET | Freq: Once | ORAL | Status: AC
  Administered 2020-09-06: 1 via ORAL
  Filled 2020-09-06: qty 1
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
## 2020-09-06 NOTE — Discharge Instructions (Signed)
Take NSAIDs or Tylenol as needed for the next week. Take this medicine with food. Take muscle relaxer at bedtime to help you sleep. This medicine makes you drowsy so do not take before driving or work Use a heating pad for sore muscles - use for 20 minutes several times a day Try gentle range of motion exercises Please follow up with your doctor
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
## 2020-09-07 ENCOUNTER — Emergency Department (HOSPITAL_COMMUNITY)
Admission: EM | Admit: 2020-09-07 | Discharge: 2020-09-07 | Disposition: A | Discharge status: Home / Self Care | Payer: BLUE CROSS/BLUE SHIELD | Attending: Emergency Medicine | Admitting: Emergency Medicine

## 2020-09-07 ENCOUNTER — Encounter (HOSPITAL_COMMUNITY): Payer: Self-pay | Admitting: Obstetrics and Gynecology

## 2020-09-07 ENCOUNTER — Other Ambulatory Visit: Payer: Self-pay

## 2020-09-07 DIAGNOSIS — Z79899 Other long term (current) drug therapy: Secondary | ICD-10-CM | POA: Insufficient documentation

## 2020-09-07 DIAGNOSIS — T7840XA Allergy, unspecified, initial encounter: Secondary | ICD-10-CM | POA: Diagnosis not present

## 2020-09-07 DIAGNOSIS — J4540 Moderate persistent asthma, uncomplicated: Secondary | ICD-10-CM | POA: Diagnosis not present

## 2020-09-07 DIAGNOSIS — I10 Essential (primary) hypertension: Secondary | ICD-10-CM | POA: Diagnosis not present

## 2020-09-07 DIAGNOSIS — Z7951 Long term (current) use of inhaled steroids: Secondary | ICD-10-CM | POA: Insufficient documentation

## 2020-09-07 MED ORDER — PREDNISONE 50 MG PO TABS: 50.0000 mg | ORAL_TABLET | Freq: Every day | ORAL | 0 refills | Status: AC

## 2020-09-07 MED ORDER — ALBUTEROL SULFATE HFA 108 (90 BASE) MCG/ACT IN AERS
2.0000 | INHALATION_SPRAY | RESPIRATORY_TRACT | Status: DC | PRN
  Administered 2020-09-07: 2 via RESPIRATORY_TRACT
  Filled 2020-09-07: qty 6.7

## 2020-09-07 MED ORDER — CETIRIZINE HCL 10 MG PO TABS: 10.0000 mg | ORAL_TABLET | Freq: Every day | ORAL | 0 refills | Status: AC
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
## 2020-09-07 NOTE — ED Triage Notes (Signed)
Patient reports he has asthma and allergies to cats and was around a cat last night. Patient uses a symbicort inhaler and no longer has an albuterol inhaler. Patient reports asthma flare up following being around the cat.

## 2020-09-07 NOTE — Discharge Instructions (Signed)
Return for new or worsening symptoms

## 2020-09-10 ENCOUNTER — Encounter (HOSPITAL_COMMUNITY): Payer: Self-pay

## 2020-09-10 ENCOUNTER — Other Ambulatory Visit: Payer: Self-pay

## 2020-09-10 ENCOUNTER — Emergency Department (HOSPITAL_COMMUNITY)
Admission: EM | Admit: 2020-09-10 | Discharge: 2020-09-10 | Disposition: A | Discharge status: Home / Self Care | Payer: BLUE CROSS/BLUE SHIELD | Attending: Emergency Medicine | Admitting: Emergency Medicine

## 2020-09-10 DIAGNOSIS — I10 Essential (primary) hypertension: Secondary | ICD-10-CM | POA: Insufficient documentation

## 2020-09-10 DIAGNOSIS — Z79899 Other long term (current) drug therapy: Secondary | ICD-10-CM | POA: Diagnosis not present

## 2020-09-10 DIAGNOSIS — M5441 Lumbago with sciatica, right side: Secondary | ICD-10-CM | POA: Insufficient documentation

## 2020-09-10 DIAGNOSIS — M542 Cervicalgia: Secondary | ICD-10-CM | POA: Insufficient documentation

## 2020-09-10 DIAGNOSIS — J45909 Unspecified asthma, uncomplicated: Secondary | ICD-10-CM | POA: Insufficient documentation

## 2020-09-10 DIAGNOSIS — M545 Low back pain, unspecified: Secondary | ICD-10-CM | POA: Diagnosis present

## 2020-09-10 DIAGNOSIS — R030 Elevated blood-pressure reading, without diagnosis of hypertension: Secondary | ICD-10-CM

## 2020-09-10 MED ORDER — HYDROCODONE-ACETAMINOPHEN 5-325 MG PO TABS
1.0000 | ORAL_TABLET | Freq: Once | ORAL | Status: AC
  Administered 2020-09-10: 1 via ORAL
  Filled 2020-09-10: qty 1

## 2020-09-10 MED ORDER — IBUPROFEN 400 MG PO TABS
600.0000 mg | ORAL_TABLET | Freq: Once | ORAL | Status: AC
  Administered 2020-09-10: 600 mg via ORAL
  Filled 2020-09-10: qty 1

## 2020-09-10 MED ORDER — LIDOCAINE 5 % EX PTCH
1.0000 | MEDICATED_PATCH | CUTANEOUS | Status: DC
  Administered 2020-09-10: 1 via TRANSDERMAL
  Filled 2020-09-10: qty 1

## 2020-09-10 MED ORDER — METHOCARBAMOL 500 MG PO TABS: 500.0000 mg | ORAL_TABLET | Freq: Two times a day (BID) | ORAL | 0 refills | Status: DC

## 2020-09-10 MED ORDER — METHOCARBAMOL 500 MG PO TABS
500.0000 mg | ORAL_TABLET | Freq: Once | ORAL | Status: AC
  Administered 2020-09-10: 500 mg via ORAL
  Filled 2020-09-10: qty 1

## 2020-09-10 MED ORDER — NAPROXEN 500 MG PO TABS: 500.0000 mg | ORAL_TABLET | Freq: Two times a day (BID) | ORAL | 0 refills | Status: DC
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
## 2020-09-10 NOTE — ED Notes (Signed)
Pt up for discharge. Pt left before receiving discharge paperwork and discharge vital signs.

## 2020-09-10 NOTE — Discharge Instructions (Addendum)
As discussed, I am sending home with a pain medication and muscle relaxer.  Take as prescribed and as needed for pain.  Muscle relaxer can cause drowsiness do not drive or operate machinery while on the medication.  Do not mix with other muscle relaxers.  You may also purchase over-the-counter Lidoderm patches and Voltaren gel for added pain relief.  Low back pain can take up to 6 weeks to improve.  I have also included low back exercises.  Please follow-up with PCP if symptoms not improved over the next few weeks.  Return to the ED for new or worsening symptoms such as lower extremity weakness, difficulties urinating, fever/chills, or worsening of symptoms.  Your BP was elevated today. Please have BP rechecked within the next week
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
## 2021-03-14 ENCOUNTER — Emergency Department (HOSPITAL_COMMUNITY): Payer: BLUE CROSS/BLUE SHIELD

## 2021-03-14 ENCOUNTER — Encounter (HOSPITAL_COMMUNITY): Payer: Self-pay

## 2021-03-14 ENCOUNTER — Ambulatory Visit (HOSPITAL_COMMUNITY): Payer: Self-pay

## 2021-03-14 ENCOUNTER — Emergency Department (HOSPITAL_COMMUNITY)
Admission: EM | Admit: 2021-03-14 | Discharge: 2021-03-14 | Disposition: A | Discharge status: Home / Self Care | Payer: BLUE CROSS/BLUE SHIELD | Attending: Emergency Medicine | Admitting: Emergency Medicine

## 2021-03-14 ENCOUNTER — Other Ambulatory Visit: Payer: Self-pay

## 2021-03-14 DIAGNOSIS — J45909 Unspecified asthma, uncomplicated: Secondary | ICD-10-CM | POA: Diagnosis not present

## 2021-03-14 DIAGNOSIS — S93602A Unspecified sprain of left foot, initial encounter: Secondary | ICD-10-CM | POA: Insufficient documentation

## 2021-03-14 DIAGNOSIS — Z7951 Long term (current) use of inhaled steroids: Secondary | ICD-10-CM | POA: Diagnosis not present

## 2021-03-14 DIAGNOSIS — I10 Essential (primary) hypertension: Secondary | ICD-10-CM | POA: Diagnosis not present

## 2021-03-14 DIAGNOSIS — Y9301 Activity, walking, marching and hiking: Secondary | ICD-10-CM | POA: Diagnosis not present

## 2021-03-14 DIAGNOSIS — S99912A Unspecified injury of left ankle, initial encounter: Secondary | ICD-10-CM | POA: Diagnosis present

## 2021-03-14 DIAGNOSIS — X501XXA Overexertion from prolonged static or awkward postures, initial encounter: Secondary | ICD-10-CM | POA: Diagnosis not present

## 2021-03-14 DIAGNOSIS — Z79899 Other long term (current) drug therapy: Secondary | ICD-10-CM | POA: Diagnosis not present

## 2021-03-14 DIAGNOSIS — S93402A Sprain of unspecified ligament of left ankle, initial encounter: Secondary | ICD-10-CM | POA: Insufficient documentation

## 2021-03-14 MED ORDER — IBUPROFEN 200 MG PO TABS
600.0000 mg | ORAL_TABLET | Freq: Once | ORAL | Status: AC
  Administered 2021-03-14: 600 mg via ORAL
  Filled 2021-03-14: qty 3

## 2021-03-14 NOTE — ED Triage Notes (Signed)
Patient c/o left foot and ankle pain. Patient states he was walking down some steps and turned his left foot over. Patient states increased pain with weight bearing.

## 2021-03-14 NOTE — ED Notes (Signed)
An After Visit Summary was printed and given to the patient. Discharge instructions given and no further questions at this time.  

## 2021-03-14 NOTE — Discharge Instructions (Addendum)
Your x-ray showed no evidence of fracture.  Use ibuprofen, Tylenol, ice and elevation.  Use brace for support and crutches to help stay off the ankle and foot, you can bear weight as tolerated.  Follow-up with your primary care doctor if symptoms not improving over the next week.

## 2021-03-14 NOTE — ED Provider Notes (Signed)
[facility]-EMERGENCY DEPT Provider Note   CSN: [id number] Arrival date & time: 03/14/21  1709     History Chief Complaint  Patient presents with   Foot Injury   Ankle Pain    Greg Stokes is a 40 y.o. male.  Greg Stokes is a 40 y.o. male with a history of asthma, hypertension, who presents to the emergency department for evaluation of left foot and ankle pain.  Patient reports 2 days ago he was walking down some steps when his left ankle, he did not fall to the ground and initially felt that he was okay but had worsening pain to the ankle and top of the left foot overnight.  He reports he has been treating it supportively but he has still had some worsening pain in particular with significant swelling.  Took 1 dose of Norco that he had leftover from a prior surgery with some improvement in pain.  No numbness or weakness.  Aggravating or alleviating factors.  The history is provided by the patient.      Past Medical History:  Diagnosis Date   Asthma    prn inhaler   Hypertension    Retained orthopedic hardware 02/2015   left hand    Patient Active Problem List   Diagnosis Date Noted   Moderate persistent asthma 09/26/2015   Elevated BP 05/01/2015   Rhinitis, allergic 04/13/2015    Past Surgical History:  Procedure Laterality Date   HARDWARE REMOVAL Left 02/19/2015   Procedure: REMOVAL PLATE/SCREWS WITH CAPSULECTOMY;  Surgeon: Dairl Ponder, MD;  Location: [facility];  Service: Orthopedics;  Laterality: Left;   OPEN REDUCTION INTERNAL FIXATION (ORIF) METACARPAL Left 07/28/2014   Procedure: OPEN REDUCTION INTERNAL FIXATION (ORIF) LEFT SMALL METACARPAL FRACTURE;  Surgeon: Dairl Ponder, MD;  Location: [facility];  Service: Orthopedics;  Laterality: Left;   TENOLYSIS Left 02/19/2015   Procedure: TENOLYSIS LEFT SMALL FINGER METACARPAL ;  Surgeon: Dairl Ponder, MD;  Location: [facility];  Service:  Orthopedics;  Laterality: Left;       Family History  Problem Relation Age of Onset   Cancer Mother    Hyperlipidemia Father     Social History   Tobacco Use   Smoking status: Never   Smokeless tobacco: Never  Vaping Use   Vaping Use: Never used  Substance Use Topics   Alcohol use: Yes    Comment: 2/week   Drug use: No    Home Medications Prior to Admission medications   Medication Sig Start Date End Date Taking? Authorizing Provider  albuterol (VENTOLIN HFA) 108 (90 Base) MCG/ACT inhaler Inhale 2 puffs into the lungs every 4 (four) hours as needed for wheezing or shortness of breath. 11/14/19   [provider]  amLODipine (NORVASC) 10 MG tablet Take 10 mg by mouth daily. 07/24/20   [provider]  amLODipine (NORVASC) 5 MG tablet TAKE 1 TABLET BY MOUTH EVERY DAY (IF PRESSURE REMAINS GREATER THAN 140 INCREASE TO 2 TABLETS) Patient not taking: Reported on 09/06/2020 01/20/19   Shade Flood, MD  Ascorbic Acid (VITAMIN C) 100 MG tablet Take 100 mg by mouth daily.    [provider]  cetirizine (ZYRTEC ALLERGY) 10 MG tablet Take 1 tablet (10 mg total) by mouth daily. 09/07/20   Henderly, Britni A, PA-C  cyclobenzaprine (FLEXERIL) 10 MG tablet Take 10 mg by mouth as needed for muscle spasms. 07/13/20   [provider]  cyclobenzaprine (FLEXERIL) 5 MG tablet Take  1 tablet (5 mg total) by mouth 2 (two) times daily as needed for muscle spasms. Patient not taking: No sig reported 06/06/20   Horton, Mayer Masker, MD  ibuprofen (ADVIL) 600 MG tablet Take 1 tablet (600 mg total) by mouth every 6 (six) hours as needed. Patient not taking: Reported on 09/06/2020 02/10/20   Dartha Lodge, PA-C  methocarbamol (ROBAXIN) 500 MG tablet Take 1 tablet (500 mg total) by mouth 2 (two) times daily. Patient not taking: No sig reported 02/10/20   Dartha Lodge, PA-C  methocarbamol (ROBAXIN) 500 MG tablet Take 1 tablet (500 mg total) by mouth 2 (two) times daily.  09/10/20   Mannie Stabile, PA-C  naproxen (NAPROSYN) 500 MG tablet Take 1 tablet (500 mg total) by mouth 2 (two) times daily. 09/10/20   Aberman, Caroline C, PA-C  SYMBICORT 80-4.5 MCG/ACT inhaler TAKE 2 PUFFS BY MOUTH TWICE A DAY Patient taking differently: Inhale 2 puffs into the lungs 2 (two) times a day. 11/08/18   Shade Flood, MD  traMADol (ULTRAM) 50 MG tablet Take 50 mg by mouth every 6 (six) hours as needed for pain. Patient not taking: No sig reported 08/24/20   [provider]    Allergies    Lisinopril and Soap  Review of Systems   Review of Systems  Constitutional:  Negative for chills and fever.  Musculoskeletal:  Positive for arthralgias. Negative for joint swelling.  Skin:  Negative for color change and rash.  Neurological:  Negative for weakness and numbness.   Physical Exam Updated Vital Signs BP (!) 167/99 (BP Location: Right Arm)   Pulse 81   Temp 98.6 F (37 C) (Oral)   Resp 14   Ht 6' (1.829 m)   Wt 98.9 kg   SpO2 98%   BMI 29.57 kg/m   Physical Exam Vitals and nursing note reviewed.  Constitutional:      General: He is not in acute distress.    Appearance: Normal appearance. He is well-developed. He is not ill-appearing or diaphoretic.  HENT:     Head: Normocephalic and atraumatic.  Eyes:     General:        Right eye: No discharge.        Left eye: No discharge.  Pulmonary:     Effort: Pulmonary effort is normal. No respiratory distress.  Musculoskeletal:     Comments: Tenderness over the left lateral malleolus and fifth metatarsal of the left foot without significant swelling or palpable deformity, no overlying ecchymosis, 2+ DP and PT pulses, normal sensation and strength.  Neurological:     Mental Status: He is alert and oriented to person, place, and time.     Coordination: Coordination normal.  Psychiatric:        Mood and Affect: Mood normal.        Behavior: Behavior normal.    ED Results / Procedures / Treatments    Labs (all labs ordered are listed, but only abnormal results are displayed) Labs Reviewed - No data to display  EKG None  Radiology DG Ankle Complete Left  Result Date: 03/14/2021 CLINICAL DATA:  40 year old male with trauma to the left lower extremity. EXAM: LEFT FOOT - COMPLETE 3+ VIEW; LEFT ANKLE COMPLETE - 3+ VIEW COMPARISON:  None. FINDINGS: There is no evidence of fracture or dislocation. There is no evidence of arthropathy or other focal bone abnormality. Soft tissues are unremarkable. IMPRESSION: Negative. Electronically Signed   By: Ceasar Mons.D.  On: 03/14/2021 18:30   DG Foot Complete Left  Result Date: 03/14/2021 CLINICAL DATA:  40 year old male with trauma to the left lower extremity. EXAM: LEFT FOOT - COMPLETE 3+ VIEW; LEFT ANKLE COMPLETE - 3+ VIEW COMPARISON:  None. FINDINGS: There is no evidence of fracture or dislocation. There is no evidence of arthropathy or other focal bone abnormality. Soft tissues are unremarkable. IMPRESSION: Negative. Electronically Signed   By: Elgie Collard M.D.   On: 03/14/2021 18:30    Procedures Procedures   Medications Ordered in ED Medications  ibuprofen (ADVIL) tablet 600 mg (600 mg Oral Given 03/14/21 1816)    ED Course  I have reviewed the triage vital signs and the nursing notes.  Pertinent labs & imaging results that were available during my care of the patient were reviewed by me and considered in my medical decision making (see chart for details).    MDM Rules/Calculators/A&P                          Presentation consistent with ankle and foot sprain. Tenderness and swelling over lateral malleolus and 5th metatarsal, pt is neurovascularly intact. X-ray reviewed by myself, agree with radiologist findings, negative for fracture, and shows ankle mortise is intact. Pain treated in the ED. Pt placed in ASO brace and provided crutches, ambulated without difficulty. Pt stable for discharge home with NSAIDs & RICE therapy.  Pt to follow-up with ortho in one week if symptoms not improving. Return precautions discussed, Pt expresses understanding and agrees with plan.  Final Clinical Impression(s) / ED Diagnoses Final diagnoses:  Sprain of left ankle, unspecified ligament, initial encounter  Sprain of left foot, initial encounter    Rx / DC Orders ED Discharge Orders     None        Legrand Rams 03/14/21 1926    Terrilee Files, MD 03/15/21 1429

## 2022-04-21 ENCOUNTER — Encounter (HOSPITAL_COMMUNITY): Payer: Self-pay | Admitting: Emergency Medicine

## 2022-04-21 ENCOUNTER — Emergency Department (HOSPITAL_COMMUNITY): Payer: BLUE CROSS/BLUE SHIELD

## 2022-04-21 ENCOUNTER — Emergency Department (HOSPITAL_COMMUNITY)
Admission: EM | Admit: 2022-04-21 | Discharge: 2022-04-21 | Discharge status: Against Medical Advice | Payer: BLUE CROSS/BLUE SHIELD | Attending: Student | Admitting: Student

## 2022-04-21 DIAGNOSIS — Z5321 Procedure and treatment not carried out due to patient leaving prior to being seen by health care provider: Secondary | ICD-10-CM | POA: Diagnosis not present

## 2022-04-21 DIAGNOSIS — M25511 Pain in right shoulder: Secondary | ICD-10-CM | POA: Diagnosis present

## 2022-04-21 NOTE — ED Notes (Signed)
Pt did not answer for room x2

## 2022-04-21 NOTE — ED Triage Notes (Signed)
Patient here with complaint of right shoulder pain that started Saturday. Pain increased with ROM. Denies chest pain, denies shortness of breath. Patient is alert, oriented, and in no apparent distress at this time.

## 2022-04-21 NOTE — ED Provider Triage Note (Signed)
Emergency Medicine Provider Triage Evaluation Note  Greg Stokes , a 41 y.o. male  was evaluated in triage.  Pt complains of right-sided shoulder pain.  Patient states that the pain began on Saturday.  He denies any known injury or trauma to the area.  Pain is worse with movement.  The patient does endorse working out chest and biceps on Thursday but does not believe he aggravated or caused the injury at that time.  Review of Systems  Positive: Right shoulder pain Negative: Chest pain, shortness of breath  Physical Exam  BP (!) 159/99 (BP Location: Right Arm)   Pulse 86   Temp 98.3 F (36.8 C) (Oral)   Resp 16   SpO2 96%  Gen:   Awake, no distress   Resp:  Normal effort  MSK:   Moves extremities without difficulty, pain with abduction and general motion of right shoulder Other:    Medical Decision Making  Medically screening exam initiated at 3:33 PM.  Appropriate orders placed.  Reese Stockman was informed that the remainder of the evaluation will be completed by another provider, this initial triage assessment does not replace that evaluation, and the importance of remaining in the ED until their evaluation is complete.     Darrick Grinder, PA-C 04/21/22 1534

## 2022-06-07 ENCOUNTER — Emergency Department (HOSPITAL_COMMUNITY): Payer: BLUE CROSS/BLUE SHIELD

## 2022-06-07 ENCOUNTER — Emergency Department (HOSPITAL_COMMUNITY)
Admission: EM | Admit: 2022-06-07 | Discharge: 2022-06-07 | Disposition: A | Discharge status: Home / Self Care | Payer: BLUE CROSS/BLUE SHIELD | Attending: Emergency Medicine | Admitting: Emergency Medicine

## 2022-06-07 ENCOUNTER — Other Ambulatory Visit: Payer: Self-pay

## 2022-06-07 DIAGNOSIS — R0602 Shortness of breath: Secondary | ICD-10-CM | POA: Diagnosis present

## 2022-06-07 DIAGNOSIS — I10 Essential (primary) hypertension: Secondary | ICD-10-CM | POA: Diagnosis not present

## 2022-06-07 DIAGNOSIS — Z20822 Contact with and (suspected) exposure to covid-19: Secondary | ICD-10-CM | POA: Diagnosis not present

## 2022-06-07 DIAGNOSIS — J45901 Unspecified asthma with (acute) exacerbation: Secondary | ICD-10-CM | POA: Diagnosis not present

## 2022-06-07 DIAGNOSIS — Z79899 Other long term (current) drug therapy: Secondary | ICD-10-CM | POA: Insufficient documentation

## 2022-06-07 LAB — BASIC METABOLIC PANEL
Anion gap: 6 (ref 5–15)
BUN: 10 mg/dL (ref 6–20)
CO2: 26 mmol/L (ref 22–32)
Calcium: 8.8 mg/dL — ABNORMAL LOW (ref 8.9–10.3)
Chloride: 107 mmol/L (ref 98–111)
Creatinine, Ser: 0.91 mg/dL (ref 0.61–1.24)
GFR, Estimated: >60 mL/min (ref >60)
Glucose, Bld: 101 mg/dL — ABNORMAL HIGH (ref 70–99)
Potassium: 3.6 mmol/L (ref 3.5–5.1)
Sodium: 139 mmol/L (ref 135–145)

## 2022-06-07 LAB — RESP PANEL BY RT-PCR (FLU A&B, COVID) ARPGX2
Influenza A by PCR: NEGATIVE
Influenza B by PCR: NEGATIVE
SARS Coronavirus 2 by RT PCR: NEGATIVE

## 2022-06-07 LAB — CBC
HCT: 43.8 % (ref 39.0–52.0)
Hemoglobin: 15.1 g/dL (ref 13.0–17.0)
MCH: 30.6 pg (ref 26.0–34.0)
MCHC: 34.5 g/dL (ref 30.0–36.0)
MCV: 88.7 fL (ref 80.0–100.0)
Platelets: 231 10*3/uL (ref 150–400)
RBC: 4.94 MIL/uL (ref 4.22–5.81)
RDW: 12.6 % (ref 11.5–15.5)
WBC: 3.4 10*3/uL — ABNORMAL LOW (ref 4.0–10.5)
nRBC: 0 % (ref 0.0–0.2)

## 2022-06-07 MED ORDER — ALBUTEROL SULFATE HFA 108 (90 BASE) MCG/ACT IN AERS: 1.0000 | INHALATION_SPRAY | Freq: Four times a day (QID) | RESPIRATORY_TRACT | 0 refills | Status: DC | PRN

## 2022-06-07 MED ORDER — METHYLPREDNISOLONE SODIUM SUCC 125 MG IJ SOLR
125.0000 mg | Freq: Every day | INTRAMUSCULAR | Status: DC
  Administered 2022-06-07: 125 mg via INTRAVENOUS
  Filled 2022-06-07: qty 2

## 2022-06-07 MED ORDER — IPRATROPIUM-ALBUTEROL 0.5-2.5 (3) MG/3ML IN SOLN
3.0000 mL | Freq: Once | RESPIRATORY_TRACT | Status: AC
  Administered 2022-06-07: 3 mL via RESPIRATORY_TRACT
  Filled 2022-06-07: qty 3

## 2022-06-07 MED ORDER — PREDNISONE 20 MG PO TABS: 60.0000 mg | ORAL_TABLET | Freq: Every day | ORAL | 0 refills | Status: DC

## 2022-06-07 NOTE — Discharge Instructions (Signed)
Please return to the ED with any new symptoms Please begin utilizing albuterol inhaler that have sent in for you Please begin taking steroids tomorrow morning.  You will take 60 mg of steroids for the next 3 days beginning on Sunday morning. Please follow-up with PCP in the next 1 week for recheck

## 2022-06-07 NOTE — ED Triage Notes (Signed)
SOB x1 day with bodyaches Hx asthma.  Pr reports inhaler is broke.

## 2022-06-07 NOTE — ED Provider Notes (Signed)
[facility] DEPT Provider Note   CSN: [id number] Arrival date & time: 06/07/22  [id number]     History  Chief Complaint  Patient presents with   Shortness of Breath    Greg Stokes is a 41 y.o. male with medical history of asthma, hypertension.  Patient presents to ED for evaluation of shortness of breath.  Patient states that beginning yesterday after doing lawn care he started developing shortness of breath and some wheezing.  The patient states that he has been out of his albuterol inhaler for the last 1 week.  The patient reports he is supposed to take 2 puffs/day for maintenance.  The patient states that his daughter took his inhaler, he has been unable to find the medication canister that goes inside of it.  The patient states that he had also had slight body aches and chills.  The patient denies any sore throat, cough, nausea, vomiting, fevers.   Shortness of Breath Associated symptoms: no cough, no fever, no sore throat and no vomiting        Home Medications Prior to Admission medications   Medication Sig Start Date End Date Taking? Authorizing Provider  albuterol (VENTOLIN HFA) 108 (90 Base) MCG/ACT inhaler Inhale 1-2 puffs into the lungs every 6 (six) hours as needed for wheezing or shortness of breath. 06/07/22  Yes Azucena Cecil, PA-C  predniSONE (DELTASONE) 20 MG tablet Take 3 tablets (60 mg total) by mouth daily. 06/07/22  Yes Azucena Cecil, PA-C  amLODipine (NORVASC) 10 MG tablet Take 10 mg by mouth daily. 07/24/20   [provider]  amLODipine (NORVASC) 5 MG tablet TAKE 1 TABLET BY MOUTH EVERY DAY (IF PRESSURE REMAINS GREATER THAN 140 INCREASE TO 2 TABLETS) Patient not taking: Reported on 09/06/2020 01/20/19   Wendie Agreste, MD  Ascorbic Acid (VITAMIN C) 100 MG tablet Take 100 mg by mouth daily.    [provider]  cetirizine (ZYRTEC ALLERGY) 10 MG tablet Take 1 tablet (10 mg total) by mouth daily. 09/07/20    Henderly, Britni A, PA-C  cyclobenzaprine (FLEXERIL) 10 MG tablet Take 10 mg by mouth as needed for muscle spasms. 07/13/20   [provider]  cyclobenzaprine (FLEXERIL) 5 MG tablet Take 1 tablet (5 mg total) by mouth 2 (two) times daily as needed for muscle spasms. Patient not taking: No sig reported 06/06/20   Horton, Barbette Hair, MD  ibuprofen (ADVIL) 600 MG tablet Take 1 tablet (600 mg total) by mouth every 6 (six) hours as needed. Patient not taking: Reported on 09/06/2020 02/10/20   Jacqlyn Larsen, PA-C  methocarbamol (ROBAXIN) 500 MG tablet Take 1 tablet (500 mg total) by mouth 2 (two) times daily. Patient not taking: No sig reported 02/10/20   Jacqlyn Larsen, PA-C  methocarbamol (ROBAXIN) 500 MG tablet Take 1 tablet (500 mg total) by mouth 2 (two) times daily. 09/10/20   Suzy Bouchard, PA-C  naproxen (NAPROSYN) 500 MG tablet Take 1 tablet (500 mg total) by mouth 2 (two) times daily. 09/10/20   Aberman, Caroline C, PA-C  SYMBICORT 80-4.5 MCG/ACT inhaler TAKE 2 PUFFS BY MOUTH TWICE A DAY Patient taking differently: Inhale 2 puffs into the lungs 2 (two) times a day. 11/08/18   Wendie Agreste, MD  traMADol (ULTRAM) 50 MG tablet Take 50 mg by mouth every 6 (six) hours as needed for pain. Patient not taking: No sig reported 08/24/20   [provider]      Allergies  Lisinopril, Cat hair extract, Grass pollen([id number] vern), and Soap    Review of Systems   Review of Systems  Constitutional:  Positive for chills. Negative for fever.  HENT:  Negative for sore throat.   Respiratory:  Positive for shortness of breath. Negative for cough.   Gastrointestinal:  Negative for nausea and vomiting.  Musculoskeletal:  Positive for myalgias.  All other systems reviewed and are negative.   Physical Exam Updated Vital Signs BP (!) 136/92   Pulse 77   Temp 98 F (36.7 C) (Oral)   Resp 16   Ht 6' (1.829 m)   Wt 96.6 kg   SpO2 97%   BMI 28.89 kg/m  Physical  Exam Vitals and nursing note reviewed.  Constitutional:      General: He is not in acute distress.    Appearance: Normal appearance. He is not ill-appearing, toxic-appearing or diaphoretic.  HENT:     Head: Normocephalic and atraumatic.     Nose: Nose normal. No congestion.     Mouth/Throat:     Mouth: Mucous membranes are moist.     Pharynx: Oropharynx is clear. No oropharyngeal exudate or posterior oropharyngeal erythema.  Eyes:     Extraocular Movements: Extraocular movements intact.     Conjunctiva/sclera: Conjunctivae normal.     Pupils: Pupils are equal, round, and reactive to light.  Cardiovascular:     Rate and Rhythm: Normal rate and regular rhythm.  Pulmonary:     Effort: Pulmonary effort is normal.     Breath sounds: Wheezing present.  Abdominal:     General: Abdomen is flat. Bowel sounds are normal.     Palpations: Abdomen is soft.     Tenderness: There is no abdominal tenderness.  Musculoskeletal:     Cervical back: Normal range of motion and neck supple. No tenderness.  Skin:    General: Skin is warm and dry.     Capillary Refill: Capillary refill takes less than 2 seconds.  Neurological:     General: No focal deficit present.     Mental Status: He is alert and oriented to person, place, and time.     ED Results / Procedures / Treatments   Labs (all labs ordered are listed, but only abnormal results are displayed) Labs Reviewed  CBC - Abnormal; Notable for the following components:      Result Value   WBC 3.4 (*)    All other components within normal limits  BASIC METABOLIC PANEL - Abnormal; Notable for the following components:   Glucose, Bld 101 (*)    Calcium 8.8 (*)    All other components within normal limits  RESP PANEL BY RT-PCR (FLU A&B, COVID) [id number]    EKG None  Radiology DG Chest 2 View  Result Date: 06/07/2022 CLINICAL DATA:  Shortness of breath. EXAM: CHEST - 2 VIEW COMPARISON:  November 24, 2016 FINDINGS: The heart size and mediastinal  contours are within normal limits. Both lungs are clear. The visualized skeletal structures are unremarkable. IMPRESSION: No active cardiopulmonary disease. Electronically Signed   By: Dorise Bullion III M.D.   On: 06/07/2022 09:51    Procedures Procedures   Medications Ordered in ED Medications  methylPREDNISolone sodium succinate (SOLU-MEDROL) 125 mg/2 mL injection 125 mg (125 mg Intravenous Given 06/07/22 0947)  ipratropium-albuterol (DUONEB) 0.5-2.5 (3) MG/3ML nebulizer solution 3 mL (3 mLs Nebulization Given 06/07/22 2878)    ED Course/ Medical Decision Making/ A&P  Medical Decision Making  41 year old male presents to the ED for evaluation.  Please see HPI for further details.  On examination the patient is afebrile, nontachycardic.  The patient lung sounds have inspiratory and expiratory wheezing on the left.  Patient is not hypoxic on room air.  The patient abdomen is soft and compressible throughout.  The patient is alert and oriented x3.  Patient posterior oropharynx is not erythematous, there is no exudate.  Due to patient complaint of shortness of breath and slight body aches we will proceed with CBC, BMP, chest x-ray, COVID swab.  Patient known asthmatic.  Patient with wheezing on examination so we will proceed with Solu-Medrol 125 mg as well as duo nebulizer.  Chest x-ray shows no consolidations or effusions.  The patient CBC is unremarkable.  The patient BMP is unremarkable.  The patient respiratory panel is negative for coronavirus, influenza.  Patient lung sounds cleared after 125 Solu-Medrol was administered along with DuoNeb.  At this time the patient stable for discharge.  The patient will be discharged home with steroid burst as well as albuterol inhaler refill.  The patient was advised to return to the ED with any new symptoms which she voiced understanding with.  The patient had all of his questions answered to his satisfaction prior to  discharge.  The patient stable at this time for discharge home.  Final Clinical Impression(s) / ED Diagnoses Final diagnoses:  Exacerbation of asthma, unspecified asthma severity, unspecified whether persistent    Rx / DC Orders ED Discharge Orders          Ordered    albuterol (VENTOLIN HFA) 108 (90 Base) MCG/ACT inhaler  Every 6 hours PRN        06/07/22 1147    predniSONE (DELTASONE) 20 MG tablet  Daily        06/07/22 1147              Azucena Cecil, [place] 06/07/22 1148    Fransico Meadow, MD 06/09/22 1203

## 2022-08-28 ENCOUNTER — Other Ambulatory Visit: Payer: Self-pay

## 2022-08-28 ENCOUNTER — Emergency Department (HOSPITAL_BASED_OUTPATIENT_CLINIC_OR_DEPARTMENT_OTHER)
Admission: EM | Admit: 2022-08-28 | Discharge: 2022-08-28 | Disposition: A | Discharge status: Home / Self Care | Payer: BLUE CROSS/BLUE SHIELD | Attending: Emergency Medicine | Admitting: Emergency Medicine

## 2022-08-28 ENCOUNTER — Encounter (HOSPITAL_BASED_OUTPATIENT_CLINIC_OR_DEPARTMENT_OTHER): Payer: Self-pay | Admitting: Emergency Medicine

## 2022-08-28 DIAGNOSIS — Z1152 Encounter for screening for COVID-19: Secondary | ICD-10-CM | POA: Insufficient documentation

## 2022-08-28 DIAGNOSIS — J101 Influenza due to other identified influenza virus with other respiratory manifestations: Secondary | ICD-10-CM | POA: Insufficient documentation

## 2022-08-28 DIAGNOSIS — R509 Fever, unspecified: Secondary | ICD-10-CM | POA: Insufficient documentation

## 2022-08-28 DIAGNOSIS — R0981 Nasal congestion: Secondary | ICD-10-CM | POA: Insufficient documentation

## 2022-08-28 LAB — RESP PANEL BY RT-PCR (RSV, FLU A&B, COVID)  RVPGX2
Influenza A by PCR: NEGATIVE
Influenza B by PCR: POSITIVE — AB
Resp Syncytial Virus by PCR: NEGATIVE
SARS Coronavirus 2 by RT PCR: NEGATIVE

## 2022-08-28 MED ORDER — BENZONATATE 100 MG PO CAPS: 100.0000 mg | ORAL_CAPSULE | Freq: Three times a day (TID) | ORAL | 0 refills | Status: DC

## 2022-08-28 NOTE — ED Provider Notes (Signed)
[facility] EMERGENCY DEPT Provider Note   CSN: [id number] Arrival date & time: 08/28/22  1236     History Chief Complaint  Patient presents with   Fever    Greg Stokes is a 41 y.o. male patient who presents to the emergency department today for further evaluation of fever, nasal congestion, myalgias, and general malaise has been ongoing for the last 2 days.  Patient states he woke up Tuesday evening with a 100.8 temperature and had subsequent to myalgias and general malaise.  He has been taking Tylenol at home.  He reports associated nasal congestion, productive cough, fatigue.  He denies any chest pain, chest tightness, or shortness of breath.  He also denies any nausea, vomiting, diarrhea, abdominal pain.   Fever      Home Medications Prior to Admission medications   Medication Sig Start Date End Date Taking? Authorizing Provider  benzonatate (TESSALON) 100 MG capsule Take 1 capsule (100 mg total) by mouth every 8 (eight) hours. 08/28/22  Yes Meredeth Ide, Tahjir Silveria M, PA-C  albuterol (VENTOLIN HFA) 108 (90 Base) MCG/ACT inhaler Inhale 1-2 puffs into the lungs every 6 (six) hours as needed for wheezing or shortness of breath. 06/07/22   Al Decant, PA-C  amLODipine (NORVASC) 10 MG tablet Take 10 mg by mouth daily. 07/24/20   [provider]  amLODipine (NORVASC) 5 MG tablet TAKE 1 TABLET BY MOUTH EVERY DAY (IF PRESSURE REMAINS GREATER THAN 140 INCREASE TO 2 TABLETS) Patient not taking: Reported on 09/06/2020 01/20/19   Shade Flood, MD  Ascorbic Acid (VITAMIN C) 100 MG tablet Take 100 mg by mouth daily.    [provider]  cetirizine (ZYRTEC ALLERGY) 10 MG tablet Take 1 tablet (10 mg total) by mouth daily. 09/07/20   Henderly, Britni A, PA-C  cyclobenzaprine (FLEXERIL) 10 MG tablet Take 10 mg by mouth as needed for muscle spasms. 07/13/20   [provider]  cyclobenzaprine (FLEXERIL) 5 MG tablet Take 1 tablet (5 mg total) by mouth 2  (two) times daily as needed for muscle spasms. Patient not taking: No sig reported 06/06/20   Horton, Mayer Masker, MD  ibuprofen (ADVIL) 600 MG tablet Take 1 tablet (600 mg total) by mouth every 6 (six) hours as needed. Patient not taking: Reported on 09/06/2020 02/10/20   Dartha Lodge, PA-C  methocarbamol (ROBAXIN) 500 MG tablet Take 1 tablet (500 mg total) by mouth 2 (two) times daily. Patient not taking: No sig reported 02/10/20   Dartha Lodge, PA-C  methocarbamol (ROBAXIN) 500 MG tablet Take 1 tablet (500 mg total) by mouth 2 (two) times daily. 09/10/20   Mannie Stabile, PA-C  naproxen (NAPROSYN) 500 MG tablet Take 1 tablet (500 mg total) by mouth 2 (two) times daily. 09/10/20   Mannie Stabile, PA-C  predniSONE (DELTASONE) 20 MG tablet Take 3 tablets (60 mg total) by mouth daily. 06/07/22   Al Decant, PA-C  SYMBICORT 80-4.5 MCG/ACT inhaler TAKE 2 PUFFS BY MOUTH TWICE A DAY Patient taking differently: Inhale 2 puffs into the lungs 2 (two) times a day. 11/08/18   Shade Flood, MD  traMADol (ULTRAM) 50 MG tablet Take 50 mg by mouth every 6 (six) hours as needed for pain. Patient not taking: No sig reported 08/24/20   [provider]      Allergies    Lisinopril, Cat hair extract, Grass pollen([id number]-swt vern), and Soap    Review of Systems   Review of Systems  Constitutional:  Positive for fever.  All other systems reviewed and are negative.   Physical Exam Updated Vital Signs BP (!) 159/94 (BP Location: Right Arm)   Pulse 93   Temp 97.8 F (36.6 C) (Temporal)   Resp 16   Ht 5\' 11"  (1.803 m)   Wt 98 kg   SpO2 98%   BMI 30.13 kg/m  Physical Exam Vitals and nursing note reviewed.  Constitutional:      General: He is not in acute distress.    Appearance: Normal appearance.  HENT:     Head: Normocephalic and atraumatic.  Eyes:     General:        Right eye: No discharge.        Left eye: No discharge.  Cardiovascular:     Comments:  Regular rate and rhythm.  S1/S2 are distinct without any evidence of murmur, rubs, or gallops.  Radial pulses are 2+ bilaterally.  Dorsalis pedis pulses are 2+ bilaterally.  No evidence of pedal edema. Pulmonary:     Comments: Clear to auscultation bilaterally.  Normal effort.  No respiratory distress.  No evidence of wheezes, rales, or rhonchi heard throughout. Abdominal:     General: Abdomen is flat. Bowel sounds are normal. There is no distension.     Tenderness: There is no abdominal tenderness. There is no guarding or rebound.  Musculoskeletal:        General: Normal range of motion.     Cervical back: Neck supple.  Skin:    General: Skin is warm and dry.     Findings: No rash.  Neurological:     General: No focal deficit present.     Mental Status: He is alert.  Psychiatric:        Mood and Affect: Mood normal.        Behavior: Behavior normal.     ED Results / Procedures / Treatments   Labs (all labs ordered are listed, but only abnormal results are displayed) Labs Reviewed  RESP PANEL BY RT-PCR (RSV, FLU A&B, COVID)  [id number]    EKG None  Radiology No results found.  Procedures Procedures    Medications Ordered in ED Medications - No data to display  ED Course/ Medical Decision Making/ A&P                           Medical Decision Making Greg Stokes is a 41 y.o. male patient who presents to the emergency department today for further evaluation of upper respiratory viral type symptoms.  This been ongoing for 2 days and do not feel that antibiotics are needed at this time.  We swabbed him for COVID, flu, and RSV.  I gave the patient the option to either wait for those results or review them on MyChart and discharge.  He opts for discharge.  Clinically I do not think that this patient has pneumonia at this time.  Lung sounds are clear in all lung fields.  I will prescribe him Tessalon Perles to help with cough suppression.  He has fluticasone at home.  I also  advised him to do over-the-counter nasal rinses.  I encouraged him to drink plenty of water and get plenty of rest.  He will follow-up with his primary care doctor in 1 week if symptoms do not get better.  He was encouraged to return to the emergency department any worsening time for worsening symptoms.  He is safe for discharge at this time.  Risk Prescription drug management.    Final Clinical Impression(s) / ED Diagnoses Final diagnoses:  Fever, unspecified fever cause  Nasal congestion    Rx / DC Orders ED Discharge Orders          Ordered    benzonatate (TESSALON) 100 MG capsule  Every 8 hours        08/28/22 1316              Myna Bright [place], [place] 08/28/22 1321    Lajean Saver, MD 08/28/22 1432

## 2022-08-28 NOTE — ED Triage Notes (Signed)
Pt arrives to ED with c/o fever and body aches that started this morning.

## 2022-08-28 NOTE — Discharge Instructions (Signed)
Based on your symptoms this seems like a viral illness.  We have swabbed you for COVID, flu, and RSV today.  This will be available on MyChart by the end of the day today.  Please continue taking Tylenol for your fever.  You may also alternate with ibuprofen if you are within the window for taking more Tylenol and spike a fever.  I have prescribed you Tessalon Perles for your cough.  Continue using your fluticasone nasal spray for your nasal congestion.  I would advise you to use saline rinses as well.  Continue to stay well-hydrated as this can help with your nasal congestion.  Get plenty of rest.  I like you to follow-up with your primary care doctor in 1 week if your symptoms do not improve.  You may return to the emergency department at any time for worsening symptoms.

## 2022-08-28 NOTE — ED Notes (Signed)
Patient verbalizes understanding of discharge instructions. Opportunity for questioning and answers were provided. Patient discharged from ED.  °

## 2022-09-02 DIAGNOSIS — H18613 Keratoconus, stable, bilateral: Secondary | ICD-10-CM | POA: Insufficient documentation

## 2022-11-13 DIAGNOSIS — J3089 Other allergic rhinitis: Secondary | ICD-10-CM | POA: Diagnosis not present

## 2022-11-13 DIAGNOSIS — J301 Allergic rhinitis due to pollen: Secondary | ICD-10-CM | POA: Diagnosis not present

## 2022-11-13 DIAGNOSIS — J4540 Moderate persistent asthma, uncomplicated: Secondary | ICD-10-CM | POA: Diagnosis not present

## 2022-11-13 DIAGNOSIS — J3081 Allergic rhinitis due to animal (cat) (dog) hair and dander: Secondary | ICD-10-CM | POA: Diagnosis not present

## 2022-11-18 DIAGNOSIS — J3081 Allergic rhinitis due to animal (cat) (dog) hair and dander: Secondary | ICD-10-CM | POA: Diagnosis not present

## 2022-11-18 DIAGNOSIS — J301 Allergic rhinitis due to pollen: Secondary | ICD-10-CM | POA: Diagnosis not present

## 2022-11-19 DIAGNOSIS — J3089 Other allergic rhinitis: Secondary | ICD-10-CM | POA: Diagnosis not present

## 2022-11-25 DIAGNOSIS — J301 Allergic rhinitis due to pollen: Secondary | ICD-10-CM | POA: Diagnosis not present

## 2022-11-25 DIAGNOSIS — J3081 Allergic rhinitis due to animal (cat) (dog) hair and dander: Secondary | ICD-10-CM | POA: Diagnosis not present

## 2022-11-25 DIAGNOSIS — J3089 Other allergic rhinitis: Secondary | ICD-10-CM | POA: Diagnosis not present

## 2022-11-27 DIAGNOSIS — J301 Allergic rhinitis due to pollen: Secondary | ICD-10-CM | POA: Diagnosis not present

## 2022-11-27 DIAGNOSIS — J3081 Allergic rhinitis due to animal (cat) (dog) hair and dander: Secondary | ICD-10-CM | POA: Diagnosis not present

## 2022-11-27 DIAGNOSIS — J3089 Other allergic rhinitis: Secondary | ICD-10-CM | POA: Diagnosis not present

## 2022-12-01 DIAGNOSIS — J3081 Allergic rhinitis due to animal (cat) (dog) hair and dander: Secondary | ICD-10-CM | POA: Diagnosis not present

## 2022-12-01 DIAGNOSIS — J301 Allergic rhinitis due to pollen: Secondary | ICD-10-CM | POA: Diagnosis not present

## 2022-12-01 DIAGNOSIS — J3089 Other allergic rhinitis: Secondary | ICD-10-CM | POA: Diagnosis not present

## 2022-12-03 DIAGNOSIS — J3089 Other allergic rhinitis: Secondary | ICD-10-CM | POA: Diagnosis not present

## 2022-12-03 DIAGNOSIS — J301 Allergic rhinitis due to pollen: Secondary | ICD-10-CM | POA: Diagnosis not present

## 2022-12-03 DIAGNOSIS — J3081 Allergic rhinitis due to animal (cat) (dog) hair and dander: Secondary | ICD-10-CM | POA: Diagnosis not present

## 2022-12-05 DIAGNOSIS — J3081 Allergic rhinitis due to animal (cat) (dog) hair and dander: Secondary | ICD-10-CM | POA: Diagnosis not present

## 2022-12-05 DIAGNOSIS — J3089 Other allergic rhinitis: Secondary | ICD-10-CM | POA: Diagnosis not present

## 2022-12-05 DIAGNOSIS — J301 Allergic rhinitis due to pollen: Secondary | ICD-10-CM | POA: Diagnosis not present

## 2022-12-10 DIAGNOSIS — J3081 Allergic rhinitis due to animal (cat) (dog) hair and dander: Secondary | ICD-10-CM | POA: Diagnosis not present

## 2022-12-10 DIAGNOSIS — J301 Allergic rhinitis due to pollen: Secondary | ICD-10-CM | POA: Diagnosis not present

## 2022-12-10 DIAGNOSIS — J3089 Other allergic rhinitis: Secondary | ICD-10-CM | POA: Diagnosis not present

## 2022-12-15 DIAGNOSIS — J3081 Allergic rhinitis due to animal (cat) (dog) hair and dander: Secondary | ICD-10-CM | POA: Diagnosis not present

## 2022-12-15 DIAGNOSIS — J301 Allergic rhinitis due to pollen: Secondary | ICD-10-CM | POA: Diagnosis not present

## 2022-12-15 DIAGNOSIS — J3089 Other allergic rhinitis: Secondary | ICD-10-CM | POA: Diagnosis not present

## 2022-12-18 DIAGNOSIS — J3081 Allergic rhinitis due to animal (cat) (dog) hair and dander: Secondary | ICD-10-CM | POA: Diagnosis not present

## 2022-12-18 DIAGNOSIS — J301 Allergic rhinitis due to pollen: Secondary | ICD-10-CM | POA: Diagnosis not present

## 2022-12-18 DIAGNOSIS — J3089 Other allergic rhinitis: Secondary | ICD-10-CM | POA: Diagnosis not present

## 2022-12-22 DIAGNOSIS — J3081 Allergic rhinitis due to animal (cat) (dog) hair and dander: Secondary | ICD-10-CM | POA: Diagnosis not present

## 2022-12-22 DIAGNOSIS — J3089 Other allergic rhinitis: Secondary | ICD-10-CM | POA: Diagnosis not present

## 2022-12-22 DIAGNOSIS — J301 Allergic rhinitis due to pollen: Secondary | ICD-10-CM | POA: Diagnosis not present

## 2022-12-24 DIAGNOSIS — J3089 Other allergic rhinitis: Secondary | ICD-10-CM | POA: Diagnosis not present

## 2022-12-24 DIAGNOSIS — J3081 Allergic rhinitis due to animal (cat) (dog) hair and dander: Secondary | ICD-10-CM | POA: Diagnosis not present

## 2022-12-24 DIAGNOSIS — J301 Allergic rhinitis due to pollen: Secondary | ICD-10-CM | POA: Diagnosis not present

## 2022-12-29 DIAGNOSIS — J3089 Other allergic rhinitis: Secondary | ICD-10-CM | POA: Diagnosis not present

## 2022-12-29 DIAGNOSIS — J301 Allergic rhinitis due to pollen: Secondary | ICD-10-CM | POA: Diagnosis not present

## 2022-12-29 DIAGNOSIS — J3081 Allergic rhinitis due to animal (cat) (dog) hair and dander: Secondary | ICD-10-CM | POA: Diagnosis not present

## 2023-01-01 DIAGNOSIS — J301 Allergic rhinitis due to pollen: Secondary | ICD-10-CM | POA: Diagnosis not present

## 2023-01-01 DIAGNOSIS — J3089 Other allergic rhinitis: Secondary | ICD-10-CM | POA: Diagnosis not present

## 2023-01-01 DIAGNOSIS — J3081 Allergic rhinitis due to animal (cat) (dog) hair and dander: Secondary | ICD-10-CM | POA: Diagnosis not present

## 2023-01-15 DIAGNOSIS — J3089 Other allergic rhinitis: Secondary | ICD-10-CM | POA: Diagnosis not present

## 2023-01-15 DIAGNOSIS — J301 Allergic rhinitis due to pollen: Secondary | ICD-10-CM | POA: Diagnosis not present

## 2023-01-15 DIAGNOSIS — J3081 Allergic rhinitis due to animal (cat) (dog) hair and dander: Secondary | ICD-10-CM | POA: Diagnosis not present

## 2023-01-23 DIAGNOSIS — J301 Allergic rhinitis due to pollen: Secondary | ICD-10-CM | POA: Diagnosis not present

## 2023-01-23 DIAGNOSIS — J3081 Allergic rhinitis due to animal (cat) (dog) hair and dander: Secondary | ICD-10-CM | POA: Diagnosis not present

## 2023-01-23 DIAGNOSIS — J3089 Other allergic rhinitis: Secondary | ICD-10-CM | POA: Diagnosis not present

## 2023-01-27 DIAGNOSIS — J3081 Allergic rhinitis due to animal (cat) (dog) hair and dander: Secondary | ICD-10-CM | POA: Diagnosis not present

## 2023-01-27 DIAGNOSIS — J3089 Other allergic rhinitis: Secondary | ICD-10-CM | POA: Diagnosis not present

## 2023-01-27 DIAGNOSIS — J301 Allergic rhinitis due to pollen: Secondary | ICD-10-CM | POA: Diagnosis not present

## 2023-02-04 DIAGNOSIS — J3089 Other allergic rhinitis: Secondary | ICD-10-CM | POA: Diagnosis not present

## 2023-02-04 DIAGNOSIS — J301 Allergic rhinitis due to pollen: Secondary | ICD-10-CM | POA: Diagnosis not present

## 2023-02-04 DIAGNOSIS — J3081 Allergic rhinitis due to animal (cat) (dog) hair and dander: Secondary | ICD-10-CM | POA: Diagnosis not present

## 2023-02-12 DIAGNOSIS — J3089 Other allergic rhinitis: Secondary | ICD-10-CM | POA: Diagnosis not present

## 2023-02-12 DIAGNOSIS — J301 Allergic rhinitis due to pollen: Secondary | ICD-10-CM | POA: Diagnosis not present

## 2023-02-12 DIAGNOSIS — J3081 Allergic rhinitis due to animal (cat) (dog) hair and dander: Secondary | ICD-10-CM | POA: Diagnosis not present

## 2023-02-19 DIAGNOSIS — J3081 Allergic rhinitis due to animal (cat) (dog) hair and dander: Secondary | ICD-10-CM | POA: Diagnosis not present

## 2023-02-19 DIAGNOSIS — J3089 Other allergic rhinitis: Secondary | ICD-10-CM | POA: Diagnosis not present

## 2023-02-19 DIAGNOSIS — J301 Allergic rhinitis due to pollen: Secondary | ICD-10-CM | POA: Diagnosis not present

## 2023-02-23 DIAGNOSIS — J301 Allergic rhinitis due to pollen: Secondary | ICD-10-CM | POA: Diagnosis not present

## 2023-02-23 DIAGNOSIS — J3089 Other allergic rhinitis: Secondary | ICD-10-CM | POA: Diagnosis not present

## 2023-02-23 DIAGNOSIS — J3081 Allergic rhinitis due to animal (cat) (dog) hair and dander: Secondary | ICD-10-CM | POA: Diagnosis not present

## 2023-03-02 DIAGNOSIS — J3081 Allergic rhinitis due to animal (cat) (dog) hair and dander: Secondary | ICD-10-CM | POA: Diagnosis not present

## 2023-03-02 DIAGNOSIS — J3089 Other allergic rhinitis: Secondary | ICD-10-CM | POA: Diagnosis not present

## 2023-03-02 DIAGNOSIS — J301 Allergic rhinitis due to pollen: Secondary | ICD-10-CM | POA: Diagnosis not present

## 2023-03-09 DIAGNOSIS — J3089 Other allergic rhinitis: Secondary | ICD-10-CM | POA: Diagnosis not present

## 2023-03-09 DIAGNOSIS — J301 Allergic rhinitis due to pollen: Secondary | ICD-10-CM | POA: Diagnosis not present

## 2023-03-09 DIAGNOSIS — J3081 Allergic rhinitis due to animal (cat) (dog) hair and dander: Secondary | ICD-10-CM | POA: Diagnosis not present

## 2023-03-13 DIAGNOSIS — L600 Ingrowing nail: Secondary | ICD-10-CM | POA: Diagnosis not present

## 2023-03-18 DIAGNOSIS — J3081 Allergic rhinitis due to animal (cat) (dog) hair and dander: Secondary | ICD-10-CM | POA: Diagnosis not present

## 2023-03-18 DIAGNOSIS — J3089 Other allergic rhinitis: Secondary | ICD-10-CM | POA: Diagnosis not present

## 2023-03-18 DIAGNOSIS — J301 Allergic rhinitis due to pollen: Secondary | ICD-10-CM | POA: Diagnosis not present

## 2023-05-04 ENCOUNTER — Emergency Department (HOSPITAL_COMMUNITY)
Admission: EM | Admit: 2023-05-04 | Discharge: 2023-05-05 | Disposition: A | Discharge status: Home / Self Care | Payer: PRIVATE HEALTH INSURANCE | Attending: Emergency Medicine | Admitting: Emergency Medicine

## 2023-05-04 ENCOUNTER — Encounter (HOSPITAL_COMMUNITY): Payer: Self-pay

## 2023-05-04 ENCOUNTER — Other Ambulatory Visit: Payer: Self-pay

## 2023-05-04 ENCOUNTER — Emergency Department (HOSPITAL_COMMUNITY): Payer: PRIVATE HEALTH INSURANCE

## 2023-05-04 DIAGNOSIS — Z79899 Other long term (current) drug therapy: Secondary | ICD-10-CM | POA: Diagnosis not present

## 2023-05-04 DIAGNOSIS — I10 Essential (primary) hypertension: Secondary | ICD-10-CM | POA: Insufficient documentation

## 2023-05-04 DIAGNOSIS — R062 Wheezing: Secondary | ICD-10-CM | POA: Diagnosis present

## 2023-05-04 DIAGNOSIS — J4521 Mild intermittent asthma with (acute) exacerbation: Secondary | ICD-10-CM | POA: Insufficient documentation

## 2023-05-04 DIAGNOSIS — J45909 Unspecified asthma, uncomplicated: Secondary | ICD-10-CM

## 2023-05-04 LAB — BASIC METABOLIC PANEL
Anion gap: 10 (ref 5–15)
BUN: 13 mg/dL (ref 6–20)
CO2: 23 mmol/L (ref 22–32)
Calcium: 9 mg/dL (ref 8.9–10.3)
Chloride: 103 mmol/L (ref 98–111)
Creatinine, Ser: 0.96 mg/dL (ref 0.61–1.24)
GFR, Estimated: >60 mL/min (ref >60)
Glucose, Bld: 100 mg/dL — ABNORMAL HIGH (ref 70–99)
Potassium: 4.1 mmol/L (ref 3.5–5.1)
Sodium: 136 mmol/L (ref 135–145)

## 2023-05-04 LAB — CBC
HCT: 46 % (ref 39.0–52.0)
Hemoglobin: 15.6 g/dL (ref 13.0–17.0)
MCH: 29.7 pg (ref 26.0–34.0)
MCHC: 33.9 g/dL (ref 30.0–36.0)
MCV: 87.5 fL (ref 80.0–100.0)
Platelets: 259 10*3/uL (ref 150–400)
RBC: 5.26 MIL/uL (ref 4.22–5.81)
RDW: 12.8 % (ref 11.5–15.5)
WBC: 3.9 10*3/uL — ABNORMAL LOW (ref 4.0–10.5)
nRBC: 0 % (ref 0.0–0.2)

## 2023-05-04 LAB — TROPONIN I (HIGH SENSITIVITY): Troponin I (High Sensitivity): 3 ng/L (ref <18)

## 2023-05-04 NOTE — ED Triage Notes (Signed)
Patient began having centralized chest pain Saturday along with shortness of breath. No nausea or vomiting.

## 2023-05-05 LAB — TROPONIN I (HIGH SENSITIVITY): Troponin I (High Sensitivity): 3 ng/L (ref <18)

## 2023-05-05 MED ORDER — BUDESONIDE-FORMOTEROL FUMARATE 80-4.5 MCG/ACT IN AERO: 2.0000 | INHALATION_SPRAY | Freq: Two times a day (BID) | RESPIRATORY_TRACT | 1 refills | Status: DC

## 2023-05-05 MED ORDER — IPRATROPIUM-ALBUTEROL 0.5-2.5 (3) MG/3ML IN SOLN
3.0000 mL | Freq: Once | RESPIRATORY_TRACT | Status: AC
  Administered 2023-05-05: 3 mL via RESPIRATORY_TRACT
  Filled 2023-05-05: qty 3

## 2023-05-05 MED ORDER — PREDNISONE 20 MG PO TABS
60.0000 mg | ORAL_TABLET | Freq: Once | ORAL | Status: AC
  Administered 2023-05-05: 60 mg via ORAL
  Filled 2023-05-05: qty 3

## 2023-05-05 MED ORDER — PREDNISONE 10 MG PO TABS: 50.0000 mg | ORAL_TABLET | Freq: Every day | ORAL | 0 refills | Status: AC

## 2023-05-05 NOTE — Discharge Instructions (Addendum)
Take Symbicort as prescribed. Take Prednisone as prescribed and complete the full course. Recheck with your PCP. Return to the ER as needed.

## 2023-05-05 NOTE — ED Provider Notes (Signed)
[place] EMERGENCY DEPARTMENT AT [facility] Provider Note   CSN: [id number] Arrival date & time: 05/04/23  6295     History  Chief Complaint  Patient presents with   Chest Pain    Quindarius Plourde is a 42 y.o. male.  42 year old male presents with complaint of chest tightness and wheezing with GERD. Symptoms started Saturday (3 days ago) with shortness of breath, no nausea, vomiting, diaphoresis. Reports limited relief with his albuterol inhaler. No prior hospitalizations related to asthma. History of asthma, HTN, states he last took his BP meds just prior to walking into the ER tonight. Patient is a non smoker, no personal or family history of PE/DVT, no leg swelling, no recent extended travel.        Home Medications Prior to Admission medications   Medication Sig Start Date End Date Taking? Authorizing Provider  albuterol (VENTOLIN HFA) 108 (90 Base) MCG/ACT inhaler Inhale 1-2 puffs into the lungs every 6 (six) hours as needed for wheezing or shortness of breath. 06/07/22   Al Decant, PA-C  amLODipine (NORVASC) 10 MG tablet Take 10 mg by mouth daily. 07/24/20   [provider]  amLODipine (NORVASC) 5 MG tablet TAKE 1 TABLET BY MOUTH EVERY DAY (IF PRESSURE REMAINS GREATER THAN 140 INCREASE TO 2 TABLETS) Patient not taking: Reported on 09/06/2020 01/20/19   Shade Flood, MD  Ascorbic Acid (VITAMIN C) 100 MG tablet Take 100 mg by mouth daily.    [provider]  benzonatate (TESSALON) 100 MG capsule Take 1 capsule (100 mg total) by mouth every 8 (eight) hours. 08/28/22   Honor Loh M, PA-C  budesonide-formoterol (SYMBICORT) 80-4.5 MCG/ACT inhaler Inhale 2 puffs into the lungs 2 (two) times daily. 05/05/23   Jeannie Fend, PA-C  cetirizine (ZYRTEC ALLERGY) 10 MG tablet Take 1 tablet (10 mg total) by mouth daily. 09/07/20   Henderly, Britni A, PA-C  cyclobenzaprine (FLEXERIL) 10 MG tablet Take 10 mg by mouth as needed for muscle spasms.  07/13/20   [provider]  cyclobenzaprine (FLEXERIL) 5 MG tablet Take 1 tablet (5 mg total) by mouth 2 (two) times daily as needed for muscle spasms. Patient not taking: No sig reported 06/06/20   Horton, Mayer Masker, MD  ibuprofen (ADVIL) 600 MG tablet Take 1 tablet (600 mg total) by mouth every 6 (six) hours as needed. Patient not taking: Reported on 09/06/2020 02/10/20   Dartha Lodge, PA-C  methocarbamol (ROBAXIN) 500 MG tablet Take 1 tablet (500 mg total) by mouth 2 (two) times daily. Patient not taking: No sig reported 02/10/20   Dartha Lodge, PA-C  methocarbamol (ROBAXIN) 500 MG tablet Take 1 tablet (500 mg total) by mouth 2 (two) times daily. 09/10/20   Mannie Stabile, PA-C  naproxen (NAPROSYN) 500 MG tablet Take 1 tablet (500 mg total) by mouth 2 (two) times daily. 09/10/20   Mannie Stabile, PA-C  predniSONE (DELTASONE) 10 MG tablet Take 5 tablets (50 mg total) by mouth daily for 4 days. 05/05/23 05/09/23  Jeannie Fend, PA-C  traMADol (ULTRAM) 50 MG tablet Take 50 mg by mouth every 6 (six) hours as needed for pain. Patient not taking: No sig reported 08/24/20   [provider]      Allergies    Lisinopril, Cat hair extract, Grass pollen(k-o-r-t-swt vern), and Soap    Review of Systems   Review of Systems Negative except as per HPI Physical Exam Updated Vital Signs BP Marland Kitchen)  161/117   Pulse 74   Temp (!) 97.5 F (36.4 C) (Oral)   Resp 14   Ht 6' (1.829 m)   Wt 98.9 kg   SpO2 98%   BMI 29.57 kg/m  Physical Exam Vitals and nursing note reviewed.  Constitutional:      General: He is not in acute distress.    Appearance: He is well-developed. He is not diaphoretic.  HENT:     Head: Normocephalic and atraumatic.  Cardiovascular:     Rate and Rhythm: Normal rate and regular rhythm.     Heart sounds: Normal heart sounds.  Pulmonary:     Effort: Pulmonary effort is normal.     Breath sounds: Examination of the right-lower field reveals decreased  breath sounds. Examination of the left-lower field reveals decreased breath sounds. Decreased breath sounds present. No wheezing.  Chest:     Chest wall: No tenderness.  Musculoskeletal:     Right lower leg: No tenderness. No edema.     Left lower leg: No tenderness. No edema.  Skin:    General: Skin is warm and dry.     Findings: No erythema or rash.  Neurological:     Mental Status: He is alert and oriented to person, place, and time.  Psychiatric:        Behavior: Behavior normal.     ED Results / Procedures / Treatments   Labs (all labs ordered are listed, but only abnormal results are displayed) Labs Reviewed  BASIC METABOLIC PANEL - Abnormal; Notable for the following components:      Result Value   Glucose, Bld 100 (*)    All other components within normal limits  CBC - Abnormal; Notable for the following components:   WBC 3.9 (*)    All other components within normal limits  TROPONIN I (HIGH SENSITIVITY)  TROPONIN I (HIGH SENSITIVITY)    EKG EKG Interpretation Date/Time:  Tuesday May 05 2023 01:34:51 EDT Ventricular Rate:  71 PR Interval:  173 QRS Duration:  86 QT Interval:  383 QTC Calculation: 417 R Axis:   51  Text Interpretation: Sinus rhythm Nonspecific T abnormalities, diffuse leads No significant change was found Confirmed by Drema Pry [phone]) on 05/05/2023 2:37:31 AM  Radiology DG Chest 2 View  Result Date: 05/04/2023 CLINICAL DATA:  chest pain EXAM: CHEST - 2 VIEW COMPARISON:  Chest x-ray 06/07/2022 FINDINGS: The heart and mediastinal contours are within normal limits. No focal consolidation. No pulmonary edema. No pleural effusion. No pneumothorax. No acute osseous abnormality. IMPRESSION: No active cardiopulmonary disease. Electronically Signed   By: Tish Frederickson M.D.   On: 05/04/2023 20:13    Procedures Procedures    Medications Ordered in ED Medications  ipratropium-albuterol (DUONEB) 0.5-2.5 (3) MG/3ML nebulizer solution 3 mL (3  mLs Nebulization Given 05/05/23 0149)  predniSONE (DELTASONE) tablet 60 mg (60 mg Oral Given 05/05/23 0149)    ED Course/ Medical Decision Making/ A&P                                 Medical Decision Making Amount and/or Complexity of Data Reviewed Labs: ordered. Radiology: ordered.  Risk Prescription drug management.   This patient presents to the ED for concern of SHOB, chest tightness, this involves an extensive number of treatment options, and is a complaint that carries with it a high risk of complications and morbidity.  The differential diagnosis includes but not limited to  asthma, ACS, PE, GERD   Co morbidities that complicate the patient evaluation  Asthma, HTN   Additional history obtained:  External records from outside source obtained and reviewed including prior labs on file   Lab Tests:  I Ordered, and personally interpreted labs.  The pertinent results include: CBC and BMP without segment findings.  Troponins are 3 and 3.   Imaging Studies ordered:  I ordered imaging studies including chest x-ray I independently visualized and interpreted imaging which showed no acute abnormality I agree with the radiologist interpretation   Cardiac Monitoring: / EKG:  The patient was maintained on a cardiac monitor.  I personally viewed and interpreted the cardiac monitored which showed an underlying rhythm of: Sinus rhythm, rate 71   Problem List / ED Course / Critical interventions / Medication management  42 year old male with complaint of chest tightness and wheezing with shortness of breath with limited relief from his inhaler.  On exam, lung sounds are diminished in the bases, there is no audible wheezing.  He is provided with a DuoNeb and prednisone, symptoms resolved on recheck.  He is out of his Symbicort, refill provided.  As well as prescription for prednisone.  Advised follow-up with his PCP.  Return to ER for worsening or concerning symptoms. I ordered  medication including DuoNeb and prednisone for chest tightness Reevaluation of the patient after these medicines showed that the patient resolved I have reviewed the patients home medicines and have made adjustments as needed   Social Determinants of Health:  Has PCP   Test / Admission - Considered:  Symptoms improved, stable for discharge         Final Clinical Impression(s) / ED Diagnoses Final diagnoses:  Mild intermittent asthma with exacerbation    Rx / DC Orders ED Discharge Orders          Ordered    budesonide-formoterol (SYMBICORT) 80-4.5 MCG/ACT inhaler  2 times daily        05/05/23 0333    predniSONE (DELTASONE) 10 MG tablet  Daily        05/05/23 0333              Jeannie Fend, PA-C 05/05/23 0446    Nira Conn, MD 05/05/23 [phone]

## 2023-07-14 ENCOUNTER — Other Ambulatory Visit: Payer: Self-pay

## 2023-07-14 ENCOUNTER — Encounter (HOSPITAL_COMMUNITY): Payer: Self-pay

## 2023-07-14 ENCOUNTER — Emergency Department (HOSPITAL_COMMUNITY): Payer: No Typology Code available for payment source

## 2023-07-14 ENCOUNTER — Emergency Department (HOSPITAL_COMMUNITY)
Admission: EM | Admit: 2023-07-14 | Discharge: 2023-07-14 | Disposition: A | Discharge status: Home / Self Care | Payer: No Typology Code available for payment source | Attending: Emergency Medicine | Admitting: Emergency Medicine

## 2023-07-14 DIAGNOSIS — J45909 Unspecified asthma, uncomplicated: Secondary | ICD-10-CM | POA: Diagnosis not present

## 2023-07-14 DIAGNOSIS — Z79899 Other long term (current) drug therapy: Secondary | ICD-10-CM | POA: Diagnosis not present

## 2023-07-14 DIAGNOSIS — J069 Acute upper respiratory infection, unspecified: Secondary | ICD-10-CM | POA: Diagnosis not present

## 2023-07-14 DIAGNOSIS — R0602 Shortness of breath: Secondary | ICD-10-CM | POA: Diagnosis present

## 2023-07-14 DIAGNOSIS — I10 Essential (primary) hypertension: Secondary | ICD-10-CM | POA: Diagnosis not present

## 2023-07-14 DIAGNOSIS — Z1152 Encounter for screening for COVID-19: Secondary | ICD-10-CM | POA: Diagnosis not present

## 2023-07-14 LAB — RESP PANEL BY RT-PCR (RSV, FLU A&B, COVID)  RVPGX2
Influenza A by PCR: NEGATIVE
Influenza B by PCR: NEGATIVE
Resp Syncytial Virus by PCR: NEGATIVE
SARS Coronavirus 2 by RT PCR: NEGATIVE

## 2023-07-14 MED ORDER — ALBUTEROL SULFATE (2.5 MG/3ML) 0.083% IN NEBU
2.5000 mg | INHALATION_SOLUTION | Freq: Once | RESPIRATORY_TRACT | Status: AC
  Administered 2023-07-14: 2.5 mg via RESPIRATORY_TRACT
  Filled 2023-07-14: qty 3

## 2023-07-14 MED ORDER — BUDESONIDE-FORMOTEROL FUMARATE 80-4.5 MCG/ACT IN AERO: 2.0000 | INHALATION_SPRAY | Freq: Two times a day (BID) | RESPIRATORY_TRACT | 0 refills | Status: DC

## 2023-07-14 MED ORDER — ALBUTEROL SULFATE HFA 108 (90 BASE) MCG/ACT IN AERS: 2.0000 | INHALATION_SPRAY | RESPIRATORY_TRACT | Status: DC | PRN

## 2023-07-14 MED ORDER — BENZONATATE 100 MG PO CAPS: 100.0000 mg | ORAL_CAPSULE | Freq: Three times a day (TID) | ORAL | 0 refills | Status: DC

## 2023-07-14 NOTE — ED Provider Notes (Signed)
[place] EMERGENCY DEPARTMENT AT [facility] Provider Note   CSN: [id number] Arrival date & time: 07/14/23  1309     History  Chief Complaint  Patient presents with   Shortness of Breath    Greg Stokes is a 42 y.o. male.  With a history of asthma, hypertension presenting for evaluation of cough and shortness of breath.  He states symptoms began Sunday afternoon when he woke up from a nap.  He has been struggling with some congestion and rhinorrhea for the past 2 weeks.  He is taking Flonase for this.  He denies any fevers or chills.  He reports some mild anterior chest wall pain after episodes of coughing.  No pain at rest.  Cough is nonproductive.  He took his albuterol inhaler today with approximately 40 minutes of relief and then states that the symptoms then returned.  He states he is out of his Symbicort.  He denies any hemoptysis, unilateral leg swelling, history of DVT or PE, estrogen or testosterone use, recent cancer treatments, surgeries, long distance travel.   Shortness of Breath Associated symptoms: cough        Home Medications Prior to Admission medications   Medication Sig Start Date End Date Taking? Authorizing Provider  benzonatate (TESSALON) 100 MG capsule Take 1 capsule (100 mg total) by mouth every 8 (eight) hours. 07/14/23  Yes Zollie Clemence, Edsel Petrin, PA-C  albuterol (VENTOLIN HFA) 108 (90 Base) MCG/ACT inhaler Inhale 1-2 puffs into the lungs every 6 (six) hours as needed for wheezing or shortness of breath. 06/07/22   Al Decant, PA-C  amLODipine (NORVASC) 10 MG tablet Take 10 mg by mouth daily. 07/24/20   [provider]  amLODipine (NORVASC) 5 MG tablet TAKE 1 TABLET BY MOUTH EVERY DAY (IF PRESSURE REMAINS GREATER THAN 140 INCREASE TO 2 TABLETS) Patient not taking: Reported on 09/06/2020 01/20/19   Shade Flood, MD  Ascorbic Acid (VITAMIN C) 100 MG tablet Take 100 mg by mouth daily.    [provider]   budesonide-formoterol (SYMBICORT) 80-4.5 MCG/ACT inhaler Inhale 2 puffs into the lungs 2 (two) times daily. 07/14/23   Press Casale, Edsel Petrin, PA-C  cetirizine (ZYRTEC ALLERGY) 10 MG tablet Take 1 tablet (10 mg total) by mouth daily. 09/07/20   Henderly, Britni A, PA-C  cyclobenzaprine (FLEXERIL) 10 MG tablet Take 10 mg by mouth as needed for muscle spasms. 07/13/20   [provider]  cyclobenzaprine (FLEXERIL) 5 MG tablet Take 1 tablet (5 mg total) by mouth 2 (two) times daily as needed for muscle spasms. Patient not taking: No sig reported 06/06/20   Horton, Mayer Masker, MD  ibuprofen (ADVIL) 600 MG tablet Take 1 tablet (600 mg total) by mouth every 6 (six) hours as needed. Patient not taking: Reported on 09/06/2020 02/10/20   Dartha Lodge, PA-C  methocarbamol (ROBAXIN) 500 MG tablet Take 1 tablet (500 mg total) by mouth 2 (two) times daily. Patient not taking: No sig reported 02/10/20   Dartha Lodge, PA-C  methocarbamol (ROBAXIN) 500 MG tablet Take 1 tablet (500 mg total) by mouth 2 (two) times daily. 09/10/20   Mannie Stabile, PA-C  naproxen (NAPROSYN) 500 MG tablet Take 1 tablet (500 mg total) by mouth 2 (two) times daily. 09/10/20   Mannie Stabile, PA-C  traMADol (ULTRAM) 50 MG tablet Take 50 mg by mouth every 6 (six) hours as needed for pain. Patient not taking: No sig reported 08/24/20   [provider]  Allergies    Lisinopril, Cat hair extract, Grass pollen([id number]-o-[id number]-swt vern), and Soap    Review of Systems   Review of Systems  Respiratory:  Positive for cough and shortness of breath.   All other systems reviewed and are negative.   Physical Exam Updated Vital Signs BP (!) 155/101   Pulse 79   Temp 98.2 F (36.8 C)   Resp 14   Ht 6' (1.829 m)   Wt 95.7 kg   SpO2 96%   BMI 28.62 kg/m  Physical Exam Vitals and nursing note reviewed.  Constitutional:      General: He is not in acute distress.    Appearance: He is well-developed.      Comments: Resting comfortably in bed  HENT:     Head: Normocephalic and atraumatic.     Mouth/Throat:     Comments: Postnasal drip with cobblestoning Eyes:     Conjunctiva/sclera: Conjunctivae normal.  Cardiovascular:     Rate and Rhythm: Normal rate and regular rhythm.     Heart sounds: No murmur heard. Pulmonary:     Effort: Pulmonary effort is normal. No respiratory distress.     Breath sounds: No decreased breath sounds, wheezing, rhonchi or rales.  Chest:     Chest wall: Tenderness present.  Abdominal:     Palpations: Abdomen is soft.     Tenderness: There is no abdominal tenderness.  Musculoskeletal:        General: No swelling.     Cervical back: Neck supple.     Right lower leg: No edema.     Left lower leg: No edema.  Skin:    General: Skin is warm and dry.     Capillary Refill: Capillary refill takes less than 2 seconds.  Neurological:     Mental Status: He is alert.  Psychiatric:        Mood and Affect: Mood normal.     ED Results / Procedures / Treatments   Labs (all labs ordered are listed, but only abnormal results are displayed) Labs Reviewed  RESP PANEL BY RT-PCR (RSV, FLU A&B, COVID)  [id number]    EKG None  Radiology DG Chest 2 View  Result Date: 07/14/2023 CLINICAL DATA:  Shortness of breath and cough, chest pain EXAM: CHEST - 2 VIEW COMPARISON:  05/04/2023 FINDINGS: Cardiac and mediastinal contours are within normal limits. No focal pulmonary opacity. No pleural effusion or pneumothorax. No acute osseous abnormality. IMPRESSION: No acute cardiopulmonary process. Electronically Signed   By: Wiliam Ke M.D.   On: 07/14/2023 16:15    Procedures Procedures    Medications Ordered in ED Medications  albuterol (PROVENTIL) (2.5 MG/3ML) 0.083% nebulizer solution 2.5 mg (2.5 mg Nebulization Given 07/14/23 1739)    ED Course/ Medical Decision Making/ A&P                               PERC Score: 0, PERC Score Interpretation: No need for further  workup, as <2% chance of PE.  If no criteria are positive and clinicians pre-test probability is <15%, PERC Rule criteria are satisfied Medical Decision Making Amount and/or Complexity of Data Reviewed Radiology: ordered.  Risk Prescription drug management.  This patient presents to the ED for concern of congestion, rhinorrhea, cough, shortness of breath, this involves an extensive number of treatment options, and is a complaint that carries with it a high risk of complications and morbidity.  The differential diagnosis includes flu,  COVID, RSV, other viral URI, pneumonia  My initial workup includes labs, imaging, nebulizer  Additional history obtained from: Nursing notes from this visit.  I ordered, reviewed and interpreted labs which include: Respiratory panel  I ordered imaging studies including chest x-ray I independently visualized and interpreted imaging which showed negative I agree with the radiologist interpretation  Afebrile, hemodynamically stable.  42 year old male presenting to the ED for evaluation of shortness of breath and cough.  He has a history of asthma.  He has not been taking his Symbicort because he ran out.  Negative respiratory panel here today.  Chest x-ray negative for acute abnormalities.  Patient appears very well on physical exam.  He does have an intermittent cough.  He has postnasal drip and posterior pharyngeal cobblestoning.  Suspect a viral process as the cause of his symptoms.  No adventitious breath sounds.  He reports improvement after albuterol nebulizer.  He was sent a refill of his Symbicort and prescription for Tessalon Perles.  He was given return precautions.  Stable at discharge.  At this time there does not appear to be any evidence of an acute emergency medical condition and the patient appears stable for discharge with appropriate outpatient follow up. Diagnosis was discussed with patient who verbalizes understanding of care plan and is agreeable  to discharge. I have discussed return precautions with patient who verbalizes understanding. Patient encouraged to follow-up with their PCP within 1 week. All questions answered.  Note: Portions of this report may have been transcribed using voice recognition software. Every effort was made to ensure accuracy; however, inadvertent computerized transcription errors may still be present.        Final Clinical Impression(s) / ED Diagnoses Final diagnoses:  Viral URI with cough    Rx / DC Orders ED Discharge Orders          Ordered    budesonide-formoterol (SYMBICORT) 80-4.5 MCG/ACT inhaler  2 times daily        07/14/23 1820    benzonatate (TESSALON) 100 MG capsule  Every 8 hours        07/14/23 1820              Michelle Piper, Cordelia Poche 07/14/23 1823    Loetta Rough, MD 07/16/23 [phone]

## 2023-07-14 NOTE — ED Triage Notes (Addendum)
Since Sunday pt has been experiencing SOB, and chest pain with coughing. Denies any N/V/D. Pt has used his albuterol inhaler with some relief. Hx of asthma.

## 2023-07-14 NOTE — Discharge Instructions (Addendum)
You have been seen today for your complaint of cough, congestion, runny nose. Your lab work was negative for flu, COVID, RSV. Your imaging was reassuring and showed no abnormality. Your discharge medications include Tessalon Perles.  This is a cough medicine.  Take it as needed for cough. Symbicort.  I have sent 1 refill. Follow up with: Your PCP in 1 week for reevaluation Please seek immediate medical care if you develop any of the following symptoms: You have shortness of breath that gets worse. You have severe or persistent: Headache. Ear pain. Sinus pain. Chest pain. You have chronic lung disease along with any of the following: Making high-pitched whistling sounds when you breathe, most often when you breathe out (wheezing). Prolonged cough (more than 14 days). Coughing up blood. A change in your usual mucus. You have a stiff neck. You have changes in your: Vision. Hearing. Thinking. Mood. At this time there does not appear to be the presence of an emergent medical condition, however there is always the potential for conditions to change. Please read and follow the below instructions.  Do not take your medicine if  develop an itchy rash, swelling in your mouth or lips, or difficulty breathing; call 911 and seek immediate emergency medical attention if this occurs.  You may review your lab tests and imaging results in their entirety on your MyChart account.  Please discuss all results of fully with your primary care provider and other specialist at your follow-up visit.  Note: Portions of this text may have been transcribed using voice recognition software. Every effort was made to ensure accuracy; however, inadvertent computerized transcription errors may still be present.

## 2023-10-21 ENCOUNTER — Ambulatory Visit: Payer: No Typology Code available for payment source | Admitting: Family Medicine

## 2023-10-21 VITALS — BP 140/84 | HR 88 | Temp 97.9°F | Ht 71.0 in | Wt 233.2 lb

## 2023-10-21 DIAGNOSIS — I10 Essential (primary) hypertension: Secondary | ICD-10-CM | POA: Diagnosis not present

## 2023-10-21 DIAGNOSIS — J4540 Moderate persistent asthma, uncomplicated: Secondary | ICD-10-CM

## 2023-10-21 DIAGNOSIS — Z23 Encounter for immunization: Secondary | ICD-10-CM

## 2023-10-21 MED ORDER — BUDESONIDE-FORMOTEROL FUMARATE 80-4.5 MCG/ACT IN AERO
2.0000 | INHALATION_SPRAY | Freq: Two times a day (BID) | RESPIRATORY_TRACT | 11 refills | Status: AC
Start: 2023-10-21 — End: ?

## 2023-10-21 MED ORDER — AMLODIPINE BESYLATE 10 MG PO TABS: 10.0000 mg | ORAL_TABLET | Freq: Every day | ORAL | 3 refills | Status: DC

## 2023-10-21 MED ORDER — ALBUTEROL SULFATE HFA 108 (90 BASE) MCG/ACT IN AERS: 1.0000 | INHALATION_SPRAY | Freq: Four times a day (QID) | RESPIRATORY_TRACT | 5 refills | Status: AC | PRN

## 2023-10-21 MED ORDER — VALSARTAN 80 MG PO TABS: 80.0000 mg | ORAL_TABLET | Freq: Every day | ORAL | 3 refills | Status: DC

## 2023-10-21 NOTE — Progress Notes (Signed)
 [facility] [address] [place] [address] Dept: [phone] Dept Fax: [phone]  New Patient Office Visit  Subjective:    Patient ID: Greg Stokes, male    DOB: [birth year], 43 y.o..   MRN: [id number]  Chief Complaint  Patient presents with   Establish Care    NP- establish care.  No concerns.    History of Present Illness:  Patient is in today to establish care. Greg Stokes was born in [place], [place]. His family moved to [place] when he was 43 years old. Later, they moved to [place], [place]. In high school, he moved to [place]. He attended [employer] for 2 years, majoring in dance movement psychotherapist and playing football. He then transferred to [employer], majoring in engineer, agricultural. His grandmother was living in [place] at the time. He left college to go live with her and stayed for ~ 4 years. He currently operates Greg Stokes, a engineer, civil (consulting). He has a significant other that he has been with for 14 years. he has two children: a son(14) and daughter (5). Greg Stokes denies use of tobacco or drugs. He drinks about 3 standard drinks a week.  Greg Stokes has a history of hypertension. He admits that he has been off of some of his medicines. He is prescribed amlodipine  10 mg daily and valsartan  80 mg daily.  Greg Stokes has a history of moderate, persistent asthma. He is managed on Symbicort  2 puffs bid and albuterol  PRN. He has not needed his albuterol  in the past week.  Past Medical History: Patient Active Problem List   Diagnosis Date Noted   Stable keratoconus of both eyes 09/02/2022   Moderate persistent asthma 09/26/2015   Essential hypertension 05/01/2015   Rhinitis, allergic 04/13/2015   Past Surgical History:  Procedure Laterality Date   HARDWARE REMOVAL Left 02/19/2015   Procedure: REMOVAL PLATE/SCREWS WITH CAPSULECTOMY;  Surgeon:  Donnice Robinsons, MD;  Location: [facility];  Service: Orthopedics;  Laterality: Left;   OPEN REDUCTION INTERNAL FIXATION (ORIF) METACARPAL Left 07/28/2014   Procedure: OPEN REDUCTION INTERNAL FIXATION (ORIF) LEFT SMALL METACARPAL FRACTURE;  Surgeon: Donnice Robinsons, MD;  Location: [facility];  Service: Orthopedics;  Laterality: Left;   TENOLYSIS Left 02/19/2015   Procedure: TENOLYSIS LEFT SMALL FINGER METACARPAL ;  Surgeon: Donnice Robinsons, MD;  Location: [facility];  Service: Orthopedics;  Laterality: Left;   Family History  Problem Relation Age of Onset   Cancer Mother        Ovarian   Asthma Mother    Hyperlipidemia Father    Cancer Father    Diabetes Paternal Uncle    Heart disease Maternal Grandfather        59s   Outpatient Medications Prior to Visit  Medication Sig Dispense Refill   Ascorbic Acid (VITAMIN C) 100 MG tablet Take 100 mg by mouth daily.     cetirizine  (ZYRTEC  ALLERGY) 10 MG tablet Take 1 tablet (10 mg total) by mouth daily. 30 tablet 0   EPINEPHrine  (EPIPEN  2-PAK) 0.3 mg/0.3 mL IJ SOAJ injection as directed Injection     methocarbamol  (ROBAXIN ) 500 MG tablet Take 1 tablet (500 mg total) by mouth 2 (two) times daily. 20 tablet 0   albuterol  (VENTOLIN  HFA) 108 (90 Base) MCG/ACT inhaler Inhale 1-2 puffs into the lungs every 6 (six) hours as needed for wheezing or shortness of breath. 18 g 0  amLODipine  (NORVASC ) 10 MG tablet Take 10 mg by mouth daily.     budesonide -formoterol  (SYMBICORT ) 80-4.5 MCG/ACT inhaler Inhale 2 puffs into the lungs 2 (two) times daily. 30.6 each 0   valsartan  (DIOVAN ) 80 MG tablet Take 80 mg by mouth daily.     fluticasone (FLONASE) 50 MCG/ACT nasal spray 2 sprays in each nostril Nasally Once a day during seasons of difficulty     amLODipine  (NORVASC ) 5 MG tablet TAKE 1 TABLET BY MOUTH EVERY DAY (IF PRESSURE REMAINS GREATER THAN 140 INCREASE TO 2 TABLETS) (Patient not taking: Reported on  09/06/2020) 30 tablet 0   benzonatate  (TESSALON ) 100 MG capsule Take 1 capsule (100 mg total) by mouth every 8 (eight) hours. 21 capsule 0   cyclobenzaprine  (FLEXERIL ) 10 MG tablet Take 10 mg by mouth as needed for muscle spasms.     cyclobenzaprine  (FLEXERIL ) 5 MG tablet Take 1 tablet (5 mg total) by mouth 2 (two) times daily as needed for muscle spasms. (Patient not taking: No sig reported) 15 tablet 0   ibuprofen  (ADVIL ) 600 MG tablet Take 1 tablet (600 mg total) by mouth every 6 (six) hours as needed. (Patient not taking: Reported on 09/06/2020) 30 tablet 0   methocarbamol  (ROBAXIN ) 500 MG tablet Take 1 tablet (500 mg total) by mouth 2 (two) times daily. (Patient not taking: No sig reported) 20 tablet 0   naproxen  (NAPROSYN ) 500 MG tablet Take 1 tablet (500 mg total) by mouth 2 (two) times daily. 30 tablet 0   traMADol (ULTRAM) 50 MG tablet Take 50 mg by mouth every 6 (six) hours as needed for pain. (Patient not taking: No sig reported)     No facility-administered medications prior to visit.   Allergies  Allergen Reactions   Lisinopril  Swelling   Cat Dander Itching, Other (See Comments) and Rash   Grass Pollen(K-O-R-T-Swt Vern) Itching and Rash   Soap Rash and Other (See Comments)    SCENTED SOAPS   Objective:   Today's Vitals   10/21/23 1314  BP: (!) 144/90  Pulse: 88  Temp: 97.9 F (36.6 C)  TempSrc: Temporal  SpO2: 97%  Weight: 233 lb 3.2 oz (105.8 kg)  Height: 5' 11 (1.803 m)   Body mass index is 32.52 kg/m.   General: Well developed, well nourished. No acute distress. Lungs: Clear to auscultation bilaterally. No wheezing, rales or rhonchi. Psych: Alert and oriented. Normal mood and affect.  Health Maintenance Due  Topic Date Due   Pneumococcal Vaccine 81-71 Years old (1 of 2 - PCV) Never done   Hepatitis C Screening  Never done   INFLUENZA VACCINE  04/16/2023     Lab Results:    Latest Ref Rng & Units 05/04/2023    7:03 PM 06/07/2022    9:27 AM 02/19/2015     9:47 AM  CBC  WBC 4.0 - 10.5 K/uL 3.9  3.4    Hemoglobin 13.0 - 17.0 g/dL 84.3  84.8  84.0   Hematocrit 39.0 - 52.0 % 46.0  43.8    Platelets 150 - 400 K/uL 259  231        Latest Ref Rng & Units 05/04/2023    7:03 PM 06/07/2022    9:27 AM 05/19/2018    8:36 AM  CMP  Glucose 70 - 99 mg/dL 899  898  92   BUN 6 - 20 mg/dL 13  10  9    Creatinine 0.61 - 1.24 mg/dL 9.03  9.08  9.00   Sodium  135 - 145 mmol/L 136  139  139   Potassium 3.5 - 5.1 mmol/L 4.1  3.6  3.6   Chloride 98 - 111 mmol/L 103  107  99   CO2 22 - 32 mmol/L 23  26  25    Calcium 8.9 - 10.3 mg/dL 9.0  8.8  9.5   Total Protein 6.0 - 8.5 g/dL   7.8   Total Bilirubin 0.0 - 1.2 mg/dL   0.6   Alkaline Phos 39 - 117 IU/L   39   AST 0 - 40 IU/L   20   ALT 0 - 44 IU/L   23    Assessment & Plan:   Problem List Items Addressed This Visit       Cardiovascular and Mediastinum   Essential hypertension - Primary   Blood pressure is high today. However, Mr. Dacus has been only taking his amlodipine  more recently. I will have him resume taking both his amlodipine  10 mg daily and valsartan  80 mg daily. I recommend he obtain an upper arm home BP cuff, log his BPs, and send them to me in 1 month via MyChart.       Relevant Medications   EPINEPHrine  (EPIPEN  2-PAK) 0.3 mg/0.3 mL IJ SOAJ injection   amLODipine  (NORVASC ) 10 MG tablet   valsartan  (DIOVAN ) 80 MG tablet     Respiratory   Moderate persistent asthma   I will plan to continue his Symbicort  2 puffs bid and his PRN albuterol .      Relevant Medications   budesonide -formoterol  (SYMBICORT ) 80-4.5 MCG/ACT inhaler   albuterol  (VENTOLIN  HFA) 108 (90 Base) MCG/ACT inhaler    Return in about 3 months (around 01/18/2024) for Annual preventative care.   Garnette CHRISTELLA Simpler, MD

## 2023-10-21 NOTE — Assessment & Plan Note (Signed)
 Blood pressure is high today. However, Greg Stokes has been only taking his amlodipine  more recently. I will have him resume taking both his amlodipine  10 mg daily and valsartan  80 mg daily. I recommend he obtain an upper arm home BP cuff, log his BPs, and send them to me in 1 month via MyChart.

## 2023-10-21 NOTE — Assessment & Plan Note (Signed)
I will plan to continue his Symbicort 2 puffs bid and his PRN albuterol.

## 2023-10-21 NOTE — Addendum Note (Signed)
 Addended by: Vergil Glasser on: 10/21/2023 02:12 PM   Modules accepted: Orders

## 2023-11-13 ENCOUNTER — Telehealth: Payer: Self-pay

## 2023-11-13 NOTE — Telephone Encounter (Signed)
 Copied from CRM [id number]. Topic: General - Other >> Nov 13, 2023  2:15 PM Truddie Crumble wrote: Reason for CRM: patient called stating his insurance keep saying that he is seeing a specialist on their end. Patient called the billing department and they advised him to call his insurance company because they can only go by what they see on the EOB. Patient stated it is the office that has to change the pcp because it keep saying the doctor is not his doctor  Called patient to address concerns and obtain clarification; left VM for call back.

## 2023-11-16 NOTE — Telephone Encounter (Signed)
 Can you help with this message? Thanks Dm/cma

## 2023-11-16 NOTE — Telephone Encounter (Signed)
 Message sent to credentialing team.

## 2024-01-20 ENCOUNTER — Emergency Department (HOSPITAL_COMMUNITY)

## 2024-01-20 ENCOUNTER — Emergency Department (HOSPITAL_COMMUNITY)
Admission: EM | Admit: 2024-01-20 | Discharge: 2024-01-20 | Disposition: A | Discharge status: Home / Self Care | Attending: Emergency Medicine | Admitting: Emergency Medicine

## 2024-01-20 ENCOUNTER — Other Ambulatory Visit: Payer: Self-pay

## 2024-01-20 ENCOUNTER — Encounter (HOSPITAL_COMMUNITY): Payer: Self-pay | Admitting: Emergency Medicine

## 2024-01-20 DIAGNOSIS — M25522 Pain in left elbow: Secondary | ICD-10-CM | POA: Insufficient documentation

## 2024-01-20 DIAGNOSIS — Y92410 Unspecified street and highway as the place of occurrence of the external cause: Secondary | ICD-10-CM | POA: Diagnosis not present

## 2024-01-20 DIAGNOSIS — M542 Cervicalgia: Secondary | ICD-10-CM | POA: Diagnosis present

## 2024-01-20 DIAGNOSIS — M545 Low back pain, unspecified: Secondary | ICD-10-CM | POA: Insufficient documentation

## 2024-01-20 MED ORDER — ACETAMINOPHEN 325 MG PO TABS
650.0000 mg | ORAL_TABLET | Freq: Once | ORAL | Status: AC
  Administered 2024-01-20: 650 mg via ORAL
  Filled 2024-01-20: qty 2

## 2024-01-20 MED ORDER — METHOCARBAMOL 500 MG PO TABS
500.0000 mg | ORAL_TABLET | Freq: Once | ORAL | Status: AC
  Administered 2024-01-20: 500 mg via ORAL
  Filled 2024-01-20: qty 1

## 2024-01-20 MED ORDER — METHOCARBAMOL 500 MG PO TABS: 500.0000 mg | ORAL_TABLET | Freq: Two times a day (BID) | ORAL | 0 refills | Status: DC

## 2024-01-20 MED ORDER — NAPROXEN 500 MG PO TABS: 500.0000 mg | ORAL_TABLET | Freq: Two times a day (BID) | ORAL | 0 refills | Status: DC
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
## 2024-02-03 ENCOUNTER — Encounter: Payer: No Typology Code available for payment source | Admitting: Family Medicine

## 2024-02-11 ENCOUNTER — Ambulatory Visit (INDEPENDENT_AMBULATORY_CARE_PROVIDER_SITE_OTHER): Admitting: Family Medicine

## 2024-02-11 ENCOUNTER — Encounter: Payer: Self-pay | Admitting: Family Medicine

## 2024-02-11 VITALS — BP 146/94 | HR 82 | Temp 96.8°F | Ht 72.0 in | Wt 242.4 lb

## 2024-02-11 DIAGNOSIS — J301 Allergic rhinitis due to pollen: Secondary | ICD-10-CM | POA: Diagnosis not present

## 2024-02-11 DIAGNOSIS — Z23 Encounter for immunization: Secondary | ICD-10-CM | POA: Diagnosis not present

## 2024-02-11 DIAGNOSIS — L209 Atopic dermatitis, unspecified: Secondary | ICD-10-CM | POA: Insufficient documentation

## 2024-02-11 DIAGNOSIS — J309 Allergic rhinitis, unspecified: Secondary | ICD-10-CM | POA: Insufficient documentation

## 2024-02-11 DIAGNOSIS — I10 Essential (primary) hypertension: Secondary | ICD-10-CM

## 2024-02-11 DIAGNOSIS — J3081 Allergic rhinitis due to animal (cat) (dog) hair and dander: Secondary | ICD-10-CM | POA: Insufficient documentation

## 2024-02-11 DIAGNOSIS — H1045 Other chronic allergic conjunctivitis: Secondary | ICD-10-CM | POA: Insufficient documentation

## 2024-02-11 DIAGNOSIS — J4540 Moderate persistent asthma, uncomplicated: Secondary | ICD-10-CM | POA: Diagnosis not present

## 2024-02-11 DIAGNOSIS — Z0000 Encounter for general adult medical examination without abnormal findings: Secondary | ICD-10-CM | POA: Diagnosis not present

## 2024-02-11 MED ORDER — AMLODIPINE BESYLATE-VALSARTAN 10-160 MG PO TABS: 1.0000 | ORAL_TABLET | Freq: Every day | ORAL | 3 refills | Status: AC

## 2024-02-11 NOTE — Assessment & Plan Note (Signed)
 Blood pressure is high today. I will switch Greg Stokes to amlodipine -valsartan  (Exforge) 10-160 mg daily (increased valsartan  dose). I will reassess him in 1 month.

## 2024-02-11 NOTE — Assessment & Plan Note (Signed)
Continue immunotherapy

## 2024-02-11 NOTE — Assessment & Plan Note (Signed)
 Overall health is excellent. Recommend ongoing regular exercise. Discussed recommended screenings and immunizations.

## 2024-02-11 NOTE — Assessment & Plan Note (Signed)
 Stable. Continue Symbicort  2 puffs bid and his PRN albuterol .

## 2024-02-11 NOTE — Progress Notes (Signed)
 [facility] [address] [place] [address] Dept: [phone] Dept Fax: [phone]  Annual Physical Visit  Subjective:    Patient ID: Greg Stokes, male    DOB: Jul 23, 1981, 43 y.o..   MRN: [id number]  Chief Complaint  Patient presents with   Annual Exam    No concerns and is not fasting   History of Present Illness:  Patient is in today for an annual physical/preventative visit.  Greg Stokes has a history of hypertension. He is prescribed amlodipine  10 mg daily and valsartan  80 mg daily. He notes he did not take his BP meds yesterday. He did take them about 1 1/2 hours before his appointment today.   Greg Stokes has a history of moderate, persistent asthma. He is managed on Symbicort  2 puffs bid and albuterol  PRN. He is using his albuterol  abotu once a week. His allergist has started him on immunotherapy and this is going well.  Review of Systems  Constitutional:  Negative for chills, diaphoresis, fever, malaise/fatigue and weight loss.  HENT:  Negative for congestion, ear pain, hearing loss, sinus pain, sore throat and tinnitus.   Eyes:  Negative for blurred vision, pain, discharge and redness.  Respiratory:  Positive for wheezing. Negative for cough and shortness of breath.        Asthma as noted above.  Cardiovascular:  Negative for chest pain and palpitations.  Gastrointestinal:  Negative for abdominal pain, constipation, diarrhea, heartburn, nausea and vomiting.  Musculoskeletal:  Negative for back pain, joint pain and myalgias.  Skin:  Negative for itching and rash.  Psychiatric/Behavioral:  Negative for depression. The patient is not nervous/anxious.    Past Medical History: Patient Active Problem List   Diagnosis Date Noted   Stable keratoconus of both eyes 09/02/2022   Moderate persistent asthma 09/26/2015   Essential hypertension 05/01/2015   Rhinitis, allergic 04/13/2015   Past Surgical History:   Procedure Laterality Date   HARDWARE REMOVAL Left 02/19/2015   Procedure: REMOVAL PLATE/SCREWS WITH CAPSULECTOMY;  Surgeon: Florida Hurter, MD;  Location: [facility];  Service: Orthopedics;  Laterality: Left;   OPEN REDUCTION INTERNAL FIXATION (ORIF) METACARPAL Left 07/28/2014   Procedure: OPEN REDUCTION INTERNAL FIXATION (ORIF) LEFT SMALL METACARPAL FRACTURE;  Surgeon: Florida Hurter, MD;  Location: [facility];  Service: Orthopedics;  Laterality: Left;   TENOLYSIS Left 02/19/2015   Procedure: TENOLYSIS LEFT SMALL FINGER METACARPAL ;  Surgeon: Florida Hurter, MD;  Location: [facility];  Service: Orthopedics;  Laterality: Left;   Family History  Problem Relation Age of Onset   Cancer Mother        Ovarian   Asthma Mother    Hyperlipidemia Father    Cancer Father    Diabetes Paternal Uncle    Heart disease Maternal Grandfather        93s   Outpatient Medications Prior to Visit  Medication Sig Dispense Refill   albuterol  (VENTOLIN  HFA) 108 (90 Base) MCG/ACT inhaler Inhale 1-2 puffs into the lungs every 6 (six) hours as needed for wheezing or shortness of breath. 18 g 5   Ascorbic Acid (VITAMIN C) 100 MG tablet Take 100 mg by mouth daily.     budesonide -formoterol  (SYMBICORT ) 80-4.5 MCG/ACT inhaler Inhale 2 puffs into the lungs 2 (two) times daily. 30.6 each 11   cetirizine  (ZYRTEC  ALLERGY) 10 MG tablet Take 1 tablet (10 mg total) by mouth daily. 30 tablet 0   EPINEPHrine  (EPIPEN  2-PAK) 0.3 mg/0.3 mL IJ  SOAJ injection as directed Injection     fluticasone (FLONASE) 50 MCG/ACT nasal spray 2 sprays in each nostril Nasally Once a day during seasons of difficulty     amLODipine  (NORVASC ) 10 MG tablet Take 1 tablet (10 mg total) by mouth daily. 90 tablet 3   valsartan  (DIOVAN ) 80 MG tablet Take 1 tablet (80 mg total) by mouth daily. 90 tablet 3   methocarbamol  (ROBAXIN ) 500 MG tablet Take 1 tablet (500 mg total) by mouth 2 (two) times daily.  (Patient not taking: Reported on 02/11/2024) 20 tablet 0   naproxen  (NAPROSYN ) 500 MG tablet Take 1 tablet (500 mg total) by mouth 2 (two) times daily. (Patient not taking: Reported on 02/11/2024) 30 tablet 0   No facility-administered medications prior to visit.   Allergies  Allergen Reactions   Lisinopril  Swelling   Cat Dander Itching, Other (See Comments) and Rash   Grass Pollen(K-O-R-T-Swt Vern) Itching and Rash   Soap Rash and Other (See Comments)    SCENTED SOAPS   Objective:   Today's Vitals   02/11/24 0852  BP: (!) 160/82  Pulse: 82  Temp: (!) 96.8 F (36 C)  TempSrc: Temporal  SpO2: 96%  Weight: 242 lb 6.4 oz (110 kg)  Height: 6' (1.829 m)   Body mass index is 32.88 kg/m.   General: Well developed, well nourished. No acute distress. HEENT: Normocephalic, non-traumatic. PERRL, EOMI. Conjunctiva clear. External ears normal. Left EAC impacted   with wax. Right EAC and TM normal. Nose clear without congestion or rhinorrhea. Mucous membranes moist.   Oropharynx clear. Good dentition. Neck: Supple. No lymphadenopathy. No thyromegaly. Lungs: Clear to auscultation bilaterally. No wheezing, rales or rhonchi. CV: RRR without murmurs or rubs. Pulses 2+ bilaterally. Abdomen: Soft, non-tender. Bowel sounds positive, normal pitch and frequency. No hepatosplenomegaly. No rebound   or guarding. Extremities: Full ROM. No joint swelling or tenderness. No edema noted. Skin: Warm and dry. No rashes. Psych: Alert and oriented. Normal mood and affect.  Health Maintenance Due  Topic Date Due   Hepatitis C Screening  Never done   Pneumococcal Vaccine 62-72 Years old (1 of 2 - PCV) Never done   COVID-19 Vaccine (5 - 2024-25 season) 05/17/2023     Assessment & Plan:   Problem List Items Addressed This Visit       Cardiovascular and Mediastinum   Essential hypertension   Blood pressure is high today. I will switch Greg Stokes to amlodipine -valsartan  (Exforge) 10-160 mg daily  (increased valsartan  dose). I will reassess him in 1 month.       Relevant Medications   amLODipine -valsartan  (EXFORGE) 10-160 MG tablet     Respiratory   Allergic rhinitis due to pollen   Continue immunotherapy.      Moderate persistent asthma   Stable. Continue Symbicort  2 puffs bid and his PRN albuterol .        Other   Annual physical exam - Primary   Overall health is excellent. Recommend ongoing regular exercise. Discussed recommended screenings and immunizations.       Other Visit Diagnoses       Need for pneumococcal 20-valent conjugate vaccination       Relevant Orders   Pneumococcal conjugate vaccine 20-valent (Completed)       Return in about 4 weeks (around 03/10/2024) for Reassessment.   Graig Lawyer, MD

## 2024-09-06 ENCOUNTER — Encounter (INDEPENDENT_AMBULATORY_CARE_PROVIDER_SITE_OTHER): Payer: Self-pay

## 2024-09-12 ENCOUNTER — Other Ambulatory Visit: Payer: Self-pay | Admitting: Medical Genetics

## 2024-09-29 ENCOUNTER — Other Ambulatory Visit: Payer: Self-pay | Admitting: Medical Genetics

## 2024-09-29 DIAGNOSIS — Z006 Encounter for examination for normal comparison and control in clinical research program: Secondary | ICD-10-CM

## 2024-10-16 LAB — GENECONNECT MOLECULAR SCREEN: Genetic Analysis Overall Interpretation: NEGATIVE

## 2025-02-13 ENCOUNTER — Encounter: Admitting: Family Medicine
